# Patient Record
Sex: Male | Born: 1965
Health system: Southern US, Community
[De-identification: ages and names within clinical notes are randomized; demographics above are authoritative.]

## PROBLEM LIST (undated history)

## (undated) DIAGNOSIS — E119 Type 2 diabetes mellitus without complications: Secondary | ICD-10-CM

## (undated) DIAGNOSIS — F1910 Other psychoactive substance abuse, uncomplicated: Secondary | ICD-10-CM

---

## 2019-12-05 ENCOUNTER — Encounter (HOSPITAL_COMMUNITY): Payer: Self-pay

## 2019-12-05 ENCOUNTER — Ambulatory Visit (HOSPITAL_COMMUNITY)
Admission: EM | Admit: 2019-12-05 | Discharge: 2019-12-05 | Disposition: A | Discharge status: Home / Self Care | Payer: Self-pay | Attending: Family Medicine | Admitting: Family Medicine

## 2019-12-05 ENCOUNTER — Other Ambulatory Visit: Payer: Self-pay

## 2019-12-05 DIAGNOSIS — IMO0001 Reserved for inherently not codable concepts without codable children: Secondary | ICD-10-CM

## 2019-12-05 DIAGNOSIS — I10 Essential (primary) hypertension: Secondary | ICD-10-CM

## 2019-12-05 DIAGNOSIS — E119 Type 2 diabetes mellitus without complications: Secondary | ICD-10-CM

## 2019-12-05 DIAGNOSIS — M25511 Pain in right shoulder: Secondary | ICD-10-CM

## 2019-12-05 DIAGNOSIS — E785 Hyperlipidemia, unspecified: Secondary | ICD-10-CM

## 2019-12-05 DIAGNOSIS — F1020 Alcohol dependence, uncomplicated: Secondary | ICD-10-CM

## 2019-12-05 DIAGNOSIS — E1140 Type 2 diabetes mellitus with diabetic neuropathy, unspecified: Secondary | ICD-10-CM

## 2019-12-05 DIAGNOSIS — M25519 Pain in unspecified shoulder: Secondary | ICD-10-CM

## 2019-12-05 DIAGNOSIS — M25512 Pain in left shoulder: Secondary | ICD-10-CM

## 2019-12-05 LAB — GLUCOSE, CAPILLARY: Glucose-Capillary: 423 mg/dL — ABNORMAL HIGH (ref 70–99)

## 2019-12-05 LAB — CBG MONITORING, ED
Glucose-Capillary: 423 mg/dL — ABNORMAL HIGH (ref 70–99)
Glucose-Capillary: 423 mg/dL — ABNORMAL HIGH (ref 70–99)

## 2019-12-05 MED ORDER — SIMVASTATIN 20 MG PO TABS: 20.0000 mg | ORAL_TABLET | Freq: Every day | ORAL | 1 refills | Status: DC

## 2019-12-05 MED ORDER — LISINOPRIL 5 MG PO TABS: 5.0000 mg | ORAL_TABLET | Freq: Every day | ORAL | 1 refills | Status: DC

## 2019-12-05 MED ORDER — METFORMIN HCL 1000 MG PO TABS: 1000.0000 mg | ORAL_TABLET | Freq: Two times a day (BID) | ORAL | 1 refills | Status: DC

## 2019-12-05 MED ORDER — BLOOD GLUCOSE MONITOR KIT: PACK | 0 refills | Status: AC

## 2019-12-05 MED ORDER — NAPROXEN 500 MG PO TABS: 500.0000 mg | ORAL_TABLET | Freq: Two times a day (BID) | ORAL | 0 refills | Status: DC

## 2019-12-05 NOTE — Discharge Instructions (Signed)
Take Metformin 2 times a day for your high blood sugar.  Take with food.  Make sure you follow a diabetic diet plan Take lisinopril once a day.  This is a mild blood pressure medication.  It helps protect your kidneys against damage from diabetes. Take simvastatin once a day.  This is to lower your cholesterol.  It reduces your risk of heart attack and stroke Take naproxen 2 times a day with food.  This is an anti-inflammatory medicine.  I hope that it helps with your chronic shoulder pain You need to call today to get an appointment with the wellness clinic for follow-up and ongoing medical care.  We do not provide ongoing medical care or refills of prescriptions here

## 2019-12-05 NOTE — ED Triage Notes (Addendum)
Patient presents to Urgent Care with complaints of tingling and numbness in his lower extremitites since running out of his diabetes mediation about a week ago. Patient reports he does not have his glucose meter to check his sugar or his metformin or insulin at this time due to his financial situation. Pt also complains of mid/ lower back pain that is chronic but worse recently when he lifts heavy objects.  If patient is given prescriptions today he would like paper copies so he can take it to the discounted site that [facility] uses.

## 2019-12-05 NOTE — ED Provider Notes (Signed)
[facility]    CSN: [id number] Arrival date & time: 12/05/19  0909      History   Chief Complaint Chief Complaint  Patient presents with  . Hyperglycemia    HPI Steven Harvey is a 53 y.o. male.   HPI  Patient is currently living at the [facility] This is a halfway house for clients with addictions Interestingly, he denies having any addictions.  He does admit to drinking too much He is currently not employed.  He states that he is "homeless He is not taking any of his medications.  He is diabetic, hypertension, hyperlipidemia, and also complains of chronic low back pain. He has not had any blood work for a while. He does agree to go see a primary care doctor in follow-up He is here today requesting medication refill He states that he has numbness in his feet from his diabetes but does not think he has any kidney complications or other problems He has not had yearly eye exams We discussed the importance of regular medical care for diabetics to prevent complications.   History reviewed. No pertinent past medical history.  Patient Active Problem List   Diagnosis Date Noted  . Diabetes ([place]) 12/05/2019  . Essential hypertension 12/05/2019  . HLD (hyperlipidemia) 12/05/2019  . Pain in joint, shoulder region 12/05/2019  . Diabetic neuropathy ([place]) 12/05/2019  . Alcoholism /alcohol abuse ([place]) 12/05/2019    History reviewed. No pertinent surgical history.     Home Medications    Prior to Admission medications   Medication Sig Start Date End Date Taking? Authorizing Provider  blood glucose meter kit and supplies KIT Dispense based on patient and insurance preference. Use up to four times daily as directed. (FOR ICD-9 250.00, 250.01). 12/05/19   Raylene Everts, MD  lisinopril (ZESTRIL) 5 MG tablet Take 1 tablet (5 mg total) by mouth daily. 12/05/19   Raylene Everts, MD  metFORMIN (GLUCOPHAGE) 1000 MG tablet Take 1 tablet (1,000 mg total) by mouth  2 (two) times daily. 12/05/19   Raylene Everts, MD  naproxen (NAPROSYN) 500 MG tablet Take 1 tablet (500 mg total) by mouth 2 (two) times daily. 12/05/19   Raylene Everts, MD  simvastatin (ZOCOR) 20 MG tablet Take 1 tablet (20 mg total) by mouth daily. 12/05/19   Raylene Everts, MD    Family History Family History  Problem Relation Age of Onset  . Heart failure Mother   . Healthy Father     Social History Social History   Tobacco Use  . Smoking status: Former Smoker    Types: Cigarettes  . Smokeless tobacco: Never Used  Substance Use Topics  . Alcohol use: Not Currently    Comment: recently quit  . Drug use: Not on file     Allergies   Patient has no known allergies.   Review of Systems Review of Systems  Constitutional: Positive for fatigue. Negative for chills and fever.  HENT: Negative for congestion and hearing loss.   Eyes: Positive for visual disturbance. Negative for pain.  Respiratory: Negative for cough and shortness of breath.   Cardiovascular: Negative for chest pain and leg swelling.  Gastrointestinal: Negative for abdominal pain, constipation and diarrhea.  Genitourinary: Negative for dysuria and frequency.  Musculoskeletal: Positive for back pain. Negative for myalgias.  Neurological: Positive for numbness. Negative for dizziness, seizures and headaches.  Psychiatric/Behavioral: Positive for sleep disturbance. The patient is not nervous/anxious.      Physical Exam  Triage Vital Signs ED Triage Vitals  Enc Vitals Group     BP 12/05/19 0939 (!) 144/89     Pulse Rate 12/05/19 0939 72     Resp 12/05/19 0939 16     Temp 12/05/19 0939 98.3 F (36.8 C)     Temp Source 12/05/19 0939 Oral     SpO2 12/05/19 0939 100 %     Weight --      Height --      Head Circumference --      Peak Flow --      Pain Score 12/05/19 0935 5     Pain Loc --      Pain Edu? --      Excl. in [place]? --    No data found.  Updated Vital Signs BP (!) 144/89 (BP  Location: Right Arm)   Pulse 72   Temp 98.3 F (36.8 C) (Oral)   Resp 16   SpO2 100%       Physical Exam Constitutional:      General: He is not in acute distress.    Appearance: Normal appearance. He is well-developed and normal weight. He is ill-appearing.     Comments: Very thin  HENT:     Head: Normocephalic and atraumatic.     Mouth/Throat:     Mouth: Mucous membranes are moist.     Comments: Poor dentition Eyes:     Conjunctiva/sclera: Conjunctivae normal.     Pupils: Pupils are equal, round, and reactive to light.  Neck:     Vascular: No carotid bruit.  Cardiovascular:     Rate and Rhythm: Normal rate and regular rhythm.     Heart sounds: Murmur present.  Pulmonary:     Effort: Pulmonary effort is normal. No respiratory distress.  Abdominal:     General: There is no distension.     Palpations: Abdomen is soft.  Musculoskeletal:        General: Normal range of motion.     Cervical back: Normal range of motion and neck supple.     Right lower leg: No edema.     Left lower leg: No edema.  Skin:    General: Skin is warm and dry.  Neurological:     Mental Status: He is alert.  Psychiatric:        Mood and Affect: Mood normal.        Behavior: Behavior normal.      UC Treatments / Results  Labs (all labs ordered are listed, but only abnormal results are displayed) Labs Reviewed  GLUCOSE, CAPILLARY - Abnormal; Notable for the following components:      Result Value   Glucose-Capillary 423 (*)    All other components within normal limits  CBG MONITORING, ED - Abnormal; Notable for the following components:   Glucose-Capillary 423 (*)    All other components within normal limits  CBG MONITORING, ED - Abnormal; Notable for the following components:   Glucose-Capillary 423 (*)    All other components within normal limits    EKG   Radiology No results found.  Procedures Procedures (including critical care time)  Medications Ordered in UC Medications  - No data to display  Initial Impression / Assessment and Plan / UC Course  I have reviewed the triage vital signs and the nursing notes.  Pertinent labs & imaging results that were available during my care of the patient were reviewed by me and considered in my medical decision making (see  chart for details).     Discussed the importance of having a PCP.  Regular medical care.  Discussed ADA recommendations for regular maintenance.  Discussed the need for yearly eye exams.  Discussed diabetic complications.  Discussed health maintenance.  Gave patient name of her PCP.  Refilled medications. Final Clinical Impressions(s) / UC Diagnoses   Final diagnoses:  Essential hypertension  Hyperlipidemia, unspecified hyperlipidemia type  Type 2 diabetes mellitus with diabetic neuropathy, without long-term current use of insulin (HCC)  Alcoholism /alcohol abuse ([place])  Pain of both shoulder joints     Discharge Instructions     Take Metformin 2 times a day for your high blood sugar.  Take with food.  Make sure you follow a diabetic diet plan Take lisinopril once a day.  This is a mild blood pressure medication.  It helps protect your kidneys against damage from diabetes. Take simvastatin once a day.  This is to lower your cholesterol.  It reduces your risk of heart attack and stroke Take naproxen 2 times a day with food.  This is an anti-inflammatory medicine.  I hope that it helps with your chronic shoulder pain You need to call today to get an appointment with the wellness clinic for follow-up and ongoing medical care.  We do not provide ongoing medical care or refills of prescriptions here   ED Prescriptions    Medication Sig Dispense Auth. Provider   blood glucose meter kit and supplies KIT Dispense based on patient and insurance preference. Use up to four times daily as directed. (FOR ICD-9 250.00, 250.01). 1 each Raylene Everts, MD   lisinopril (ZESTRIL) 5 MG tablet Take 1 tablet (5 mg  total) by mouth daily. 30 tablet Raylene Everts, MD   metFORMIN (GLUCOPHAGE) 1000 MG tablet Take 1 tablet (1,000 mg total) by mouth 2 (two) times daily. 30 tablet Raylene Everts, MD   naproxen (NAPROSYN) 500 MG tablet Take 1 tablet (500 mg total) by mouth 2 (two) times daily. 30 tablet Raylene Everts, MD   simvastatin (ZOCOR) 20 MG tablet Take 1 tablet (20 mg total) by mouth daily. 30 tablet Raylene Everts, MD     PDMP not reviewed this encounter.   Raylene Everts, MD 12/05/19 [phone]

## 2020-01-28 ENCOUNTER — Encounter (HOSPITAL_COMMUNITY): Payer: Self-pay

## 2020-01-28 ENCOUNTER — Emergency Department (HOSPITAL_COMMUNITY)
Admission: EM | Admit: 2020-01-28 | Discharge: 2020-01-28 | Disposition: A | Discharge status: Home / Self Care | Payer: HRSA Program | Attending: Emergency Medicine | Admitting: Emergency Medicine

## 2020-01-28 ENCOUNTER — Emergency Department (HOSPITAL_COMMUNITY): Payer: HRSA Program

## 2020-01-28 ENCOUNTER — Other Ambulatory Visit: Payer: Self-pay

## 2020-01-28 DIAGNOSIS — R10817 Generalized abdominal tenderness: Secondary | ICD-10-CM | POA: Insufficient documentation

## 2020-01-28 DIAGNOSIS — R1013 Epigastric pain: Secondary | ICD-10-CM | POA: Insufficient documentation

## 2020-01-28 DIAGNOSIS — R112 Nausea with vomiting, unspecified: Secondary | ICD-10-CM | POA: Insufficient documentation

## 2020-01-28 DIAGNOSIS — M7918 Myalgia, other site: Secondary | ICD-10-CM | POA: Insufficient documentation

## 2020-01-28 DIAGNOSIS — R0789 Other chest pain: Secondary | ICD-10-CM

## 2020-01-28 DIAGNOSIS — R519 Headache, unspecified: Secondary | ICD-10-CM | POA: Insufficient documentation

## 2020-01-28 DIAGNOSIS — U071 COVID-19: Secondary | ICD-10-CM

## 2020-01-28 DIAGNOSIS — E119 Type 2 diabetes mellitus without complications: Secondary | ICD-10-CM | POA: Insufficient documentation

## 2020-01-28 DIAGNOSIS — R52 Pain, unspecified: Secondary | ICD-10-CM

## 2020-01-28 DIAGNOSIS — Z7984 Long term (current) use of oral hypoglycemic drugs: Secondary | ICD-10-CM | POA: Diagnosis not present

## 2020-01-28 HISTORY — DX: Type 2 diabetes mellitus without complications: E11.9

## 2020-01-28 LAB — HEPATIC FUNCTION PANEL
ALT: 13 U/L (ref 0–44)
AST: 15 U/L (ref 15–41)
Albumin: 3.4 g/dL — ABNORMAL LOW (ref 3.5–5.0)
Alkaline Phosphatase: 70 U/L (ref 38–126)
Bilirubin, Direct: <0.1 mg/dL (ref 0.0–0.2)
Total Bilirubin: 0.8 mg/dL (ref 0.3–1.2)
Total Protein: 6.5 g/dL (ref 6.5–8.1)

## 2020-01-28 LAB — CBC
HCT: 41.4 % (ref 39.0–52.0)
Hemoglobin: 13.2 g/dL (ref 13.0–17.0)
MCH: 28.1 pg (ref 26.0–34.0)
MCHC: 31.9 g/dL (ref 30.0–36.0)
MCV: 88.3 fL (ref 80.0–100.0)
Platelets: 201 10*3/uL (ref 150–400)
RBC: 4.69 MIL/uL (ref 4.22–5.81)
RDW: 12.1 % (ref 11.5–15.5)
WBC: 5.8 10*3/uL (ref 4.0–10.5)
nRBC: 0 % (ref 0.0–0.2)

## 2020-01-28 LAB — BASIC METABOLIC PANEL
Anion gap: 15 (ref 5–15)
BUN: 9 mg/dL (ref 6–20)
CO2: 21 mmol/L — ABNORMAL LOW (ref 22–32)
Calcium: 8.7 mg/dL — ABNORMAL LOW (ref 8.9–10.3)
Chloride: 100 mmol/L (ref 98–111)
Creatinine, Ser: 1.08 mg/dL (ref 0.61–1.24)
GFR calc Af Amer: >60 mL/min (ref >60)
GFR calc non Af Amer: >60 mL/min (ref >60)
Glucose, Bld: 409 mg/dL — ABNORMAL HIGH (ref 70–99)
Potassium: 4.1 mmol/L (ref 3.5–5.1)
Sodium: 136 mmol/L (ref 135–145)

## 2020-01-28 LAB — POC SARS CORONAVIRUS 2 AG -  ED: SARS Coronavirus 2 Ag: POSITIVE — AB

## 2020-01-28 LAB — TROPONIN I (HIGH SENSITIVITY)
Troponin I (High Sensitivity): 5 ng/L (ref <18)
Troponin I (High Sensitivity): 8 ng/L (ref <18)

## 2020-01-28 LAB — D-DIMER, QUANTITATIVE: D-Dimer, Quant: 0.78 ug/mL-FEU — ABNORMAL HIGH (ref 0.00–0.50)

## 2020-01-28 LAB — CBG MONITORING, ED: Glucose-Capillary: 311 mg/dL — ABNORMAL HIGH (ref 70–99)

## 2020-01-28 LAB — LIPASE, BLOOD: Lipase: 24 U/L (ref 11–51)

## 2020-01-28 MED ORDER — IOHEXOL 350 MG/ML SOLN
100.0000 mL | Freq: Once | INTRAVENOUS | Status: AC | PRN
  Administered 2020-01-28: 100 mL via INTRAVENOUS

## 2020-01-28 MED ORDER — ONDANSETRON 4 MG PO TBDP: 4.0000 mg | ORAL_TABLET | Freq: Three times a day (TID) | ORAL | 0 refills | Status: DC | PRN

## 2020-01-28 MED ORDER — SODIUM CHLORIDE 0.9% FLUSH
3.0000 mL | Freq: Once | INTRAVENOUS | Status: AC
  Administered 2020-01-28: 3 mL via INTRAVENOUS

## 2020-01-28 MED ORDER — ACETAMINOPHEN 325 MG PO TABS
650.0000 mg | ORAL_TABLET | Freq: Once | ORAL | Status: AC
  Administered 2020-01-28: 650 mg via ORAL
  Filled 2020-01-28: qty 2

## 2020-01-28 MED ORDER — PANTOPRAZOLE SODIUM 40 MG IV SOLR
40.0000 mg | Freq: Once | INTRAVENOUS | Status: AC
  Administered 2020-01-28: 40 mg via INTRAVENOUS
  Filled 2020-01-28: qty 40

## 2020-01-28 MED ORDER — SODIUM CHLORIDE 0.9 % IV BOLUS
1000.0000 mL | Freq: Once | INTRAVENOUS | Status: AC
  Administered 2020-01-28: 1000 mL via INTRAVENOUS

## 2020-01-28 MED ORDER — IBUPROFEN 400 MG PO TABS: 400.0000 mg | ORAL_TABLET | Freq: Three times a day (TID) | ORAL | 0 refills | Status: DC | PRN

## 2020-01-28 MED ORDER — LISINOPRIL 5 MG PO TABS: 5.0000 mg | ORAL_TABLET | Freq: Every day | ORAL | 0 refills | Status: DC

## 2020-01-28 MED ORDER — METFORMIN HCL 1000 MG PO TABS: 1000.0000 mg | ORAL_TABLET | Freq: Two times a day (BID) | ORAL | 0 refills | Status: DC

## 2020-01-28 MED ORDER — ACETAMINOPHEN 325 MG PO CAPS: 650.0000 mg | ORAL_CAPSULE | Freq: Three times a day (TID) | ORAL | 0 refills | Status: DC | PRN

## 2020-01-28 MED ORDER — ALUM & MAG HYDROXIDE-SIMETH 200-200-20 MG/5ML PO SUSP
30.0000 mL | Freq: Once | ORAL | Status: AC
  Administered 2020-01-28: 30 mL via ORAL
  Filled 2020-01-28: qty 30

## 2020-01-28 MED ORDER — IBUPROFEN 400 MG PO TABS
600.0000 mg | ORAL_TABLET | Freq: Once | ORAL | Status: AC
  Administered 2020-01-28: 600 mg via ORAL
  Filled 2020-01-28: qty 1

## 2020-01-28 MED ORDER — LIDOCAINE VISCOUS HCL 2 % MT SOLN
15.0000 mL | Freq: Once | OROMUCOSAL | Status: AC
  Administered 2020-01-28: 15 mL via ORAL
  Filled 2020-01-28: qty 15

## 2020-01-28 NOTE — ED Notes (Signed)
Patient verbalizes understanding of discharge instructions. Opportunity for questioning and answers were provided. Armband removed by staff, pt discharged from ED.  

## 2020-01-28 NOTE — Care Management (Addendum)
ED CM spoke with [facility] liaison, a referral packet was sent over and completed and sent back, patient was approved, patient awaiting [facility] transport

## 2020-01-28 NOTE — ED Provider Notes (Signed)
[facility] EMERGENCY DEPARTMENT Provider Note   CSN: [id number] Arrival date & time: 01/28/20  1884     History Chief Complaint  Patient presents with  . Chest Pain    Steven Harvey is a 54 y.o. male.  The history is provided by the patient and medical records. No language interpreter was used.  Chest Pain  Steven Harvey is a 54 y.o. male who presents to the Emergency Department complaining of chest pain and abdominal pain. He presents the emergency department complaining of multiple complaints that began two days ago. He states that he is in a transitional house for alcohol rehabilitation for the last two months. As in agreement for this program he is required to work six days a week. 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 Over the last two days he is experienced headaches, left-sided chest pain, epigastric pain. Yesterday he developed nausea and vomiting. He complains of associated poor appetite. He denies any fevers. No diarrhea. No known COVID 19 exposures but he is in a group home environment. He has a history of diabetes. He is not had alcohol for two months. He does smoke two cigarettes a day. Symptoms are moderate to severe, constant, worsening.    Past Medical History:  Diagnosis Date  . Diabetes mellitus without complication [facility])     Patient Active Problem List   Diagnosis Date Noted  . Diabetes ([place]) 12/05/2019  . Essential hypertension 12/05/2019  . HLD (hyperlipidemia) 12/05/2019  . Pain in joint, shoulder region 12/05/2019  . Diabetic neuropathy ([place]) 12/05/2019  . Alcoholism /alcohol abuse ([place]) 12/05/2019    History reviewed. No pertinent surgical history.     Family History  Problem Relation Age of Onset  . Heart failure Mother   . Healthy Father     Social History   Tobacco Use  . Smoking status: Former Smoker    Types: Cigarettes  . Smokeless tobacco: Never Used    Substance Use Topics  . Alcohol use: Not Currently    Comment: recently quit  . Drug use: Not on file    Home Medications Prior to Admission medications   Medication Sig Start Date End Date Taking? Authorizing Provider  blood glucose meter kit and supplies KIT Dispense based on patient and insurance preference. Use up to four times daily as directed. (FOR ICD-9 250.00, 250.01). 12/05/19   Raylene Everts, MD  lisinopril (ZESTRIL) 5 MG tablet Take 1 tablet (5 mg total) by mouth daily. 12/05/19   Raylene Everts, MD  metFORMIN (GLUCOPHAGE) 1000 MG tablet Take 1 tablet (1,000 mg total) by mouth 2 (two) times daily. 12/05/19   Raylene Everts, MD  naproxen (NAPROSYN) 500 MG tablet Take 1 tablet (500 mg total) by mouth 2 (two) times daily. 12/05/19   Raylene Everts, MD  simvastatin (ZOCOR) 20 MG tablet Take 1 tablet (20 mg total) by mouth daily. 12/05/19   Raylene Everts, MD    Allergies    Patient has no known allergies.  Review of Systems   Review of Systems  Cardiovascular: Positive for chest pain.  All other systems reviewed and are negative.   Physical Exam Updated Vital Signs BP (!) 167/73 (BP Location: Left Arm)   Pulse 78   Temp 98.7 F (37.1 C) (Oral)   Resp 12   Ht '5\' 8"'$  (1.727 m)   Wt 59.9 kg   SpO2 100%  BMI 20.07 kg/m   Physical Exam Vitals and nursing note reviewed.  Constitutional:      Appearance: He is well-developed.  HENT:     Head: Normocephalic and atraumatic.  Cardiovascular:     Rate and Rhythm: Normal rate and regular rhythm.     Heart sounds: No murmur.  Pulmonary:     Effort: Pulmonary effort is normal. No respiratory distress.     Breath sounds: Normal breath sounds.  Abdominal:     Palpations: Abdomen is soft.     Tenderness: There is no guarding or rebound.     Comments: Mild generalized abdominal tenderness  Musculoskeletal:        General: No tenderness.     Comments: 2+ radial and DP pulses bilaterally  Skin:     General: Skin is warm and dry.  Neurological:     Mental Status: He is alert and oriented to person, place, and time.     Comments: Five out of five strength in all four extremities.  Psychiatric:        Behavior: Behavior normal.     ED Results / Procedures / Treatments   Labs (all labs ordered are listed, but only abnormal results are displayed) Labs Reviewed  BASIC METABOLIC PANEL - Abnormal; Notable for the following components:      Result Value   CO2 21 (*)    Glucose, Bld 409 (*)    Calcium 8.7 (*)    All other components within normal limits  HEPATIC FUNCTION PANEL - Abnormal; Notable for the following components:   Albumin 3.4 (*)    All other components within normal limits  D-DIMER, QUANTITATIVE (NOT AT [facility]) - Abnormal; Notable for the following components:   D-Dimer, Quant 0.78 (*)    All other components within normal limits  POC SARS CORONAVIRUS 2 AG -  ED - Abnormal; Notable for the following components:   SARS Coronavirus 2 Ag POSITIVE (*)    All other components within normal limits  CBG MONITORING, ED - Abnormal; Notable for the following components:   Glucose-Capillary 311 (*)    All other components within normal limits  CBC  LIPASE, BLOOD  TROPONIN I (HIGH SENSITIVITY)  TROPONIN I (HIGH SENSITIVITY)    EKG EKG Interpretation  Date/Time:  Saturday January 28 2020 06:36:16 EST Ventricular Rate:  101 PR Interval:  140 QRS Duration: 76 QT Interval:  370 QTC Calculation: 479 R Axis:   85 Text Interpretation: Sinus tachycardia Septal infarct , age undetermined Abnormal ECG no prior available for comparison Confirmed by Quintella Reichert [phone]) on 01/28/2020 6:53:38 AM   Radiology DG Chest 2 View  Result Date: 01/28/2020 CLINICAL DATA:  Chest pain EXAM: CHEST - 2 VIEW COMPARISON:  None. FINDINGS: Normal heart size and mediastinal contours. Artifact from EKG leads. No acute infiltrate or edema. No effusion or pneumothorax. No acute osseous findings.  IMPRESSION: Negative chest. Electronically Signed   By: Monte Fantasia M.D.   On: 01/28/2020 06:59   CT Angio Chest/Abd/Pel for Dissection W and/or W/WO  Result Date: 01/28/2020 CLINICAL DATA:  Chest and back pain beginning today. Possible aortic dissection. EXAM: CT ANGIOGRAPHY CHEST, ABDOMEN AND PELVIS TECHNIQUE: Multidetector CT imaging through the chest, abdomen and pelvis was performed using the standard protocol during bolus administration of intravenous contrast. Multiplanar reconstructed images and MIPs were obtained and reviewed to evaluate the vascular anatomy. CONTRAST:  100 mL Omnipaque 350 IV. COMPARISON:  None. FINDINGS: CTA CHEST FINDINGS Cardiovascular: Heart is normal  size. Thoracic aorta is normal in caliber without evidence of aneurysm or dissection. Normal 3 vessel takeoff from the arch. Pulmonary arterial system is well opacified and otherwise unremarkable. Mediastinum/Nodes: No mediastinal or hilar adenopathy. Remaining mediastinal structures are normal. Mildly prominent right axillary lymph node measuring 1.2 cm by short axis. Lungs/Pleura: Lungs are clear.  Airways are normal. Musculoskeletal: No acute findings. Review of the MIP images confirms the above findings. CTA ABDOMEN AND PELVIS FINDINGS VASCULAR Aorta: Normal in caliber without evidence of aneurysm or dissection. Celiac: Patent. SMA: Patent. Renals: Single bilateral renal arteries are patent. IMA: Patent. Inflow: Minimal calcified plaque over the right common iliac artery as the iliac arteries are otherwise patent. Minimal calcified plaque at the femoral artery bifurcation bilaterally. Veins: Normal. Review of the MIP images confirms the above findings. NON-VASCULAR Hepatobiliary: Liver, gallbladder and biliary tree are normal. Pancreas: Normal. Spleen: Normal. Adrenals/Urinary Tract: Adrenal glands are normal. Kidneys are normal in size without hydronephrosis or nephrolithiasis. Bladder mildly distended. Stomach/Bowel: Stomach  and small bowel are normal. Appendix is normal. Colon is unremarkable. Lymphatic: No adenopathy. Reproductive: Normal. Other: No free fluid or focal inflammatory change. Musculoskeletal: No acute findings. Review of the MIP images confirms the above findings. IMPRESSION: 1. Unremarkable thoracoabdominal aorta without evidence of aneurysm or dissection. 2.  No acute findings in the chest, abdomen or pelvis. 3. Prominent right axillary lymph node measuring 1.2 cm by short axis. Recommend clinical correlation and right axillary ultrasound for further evaluation. Electronically Signed   By: Marin Olp M.D.   On: 01/28/2020 09:20    Procedures Procedures (including critical care time)  Medications Ordered in ED Medications  sodium chloride flush (NS) 0.9 % injection 3 mL (3 mLs Intravenous Given 01/28/20 0800)  alum & mag hydroxide-simeth (MAALOX/MYLANTA) 200-200-20 MG/5ML suspension 30 mL (30 mLs Oral Given 01/28/20 0756)    And  lidocaine (XYLOCAINE) 2 % viscous mouth solution 15 mL (15 mLs Oral Given 01/28/20 0756)  pantoprazole (PROTONIX) injection 40 mg (40 mg Intravenous Given 01/28/20 0756)  sodium chloride 0.9 % bolus 1,000 mL (0 mLs Intravenous Stopped 01/28/20 1101)  iohexol (OMNIPAQUE) 350 MG/ML injection 100 mL (100 mLs Intravenous Contrast Given 01/28/20 0902)  ibuprofen (ADVIL) tablet 600 mg (600 mg Oral Given 01/28/20 1116)  acetaminophen (TYLENOL) tablet 650 mg (650 mg Oral Given 01/28/20 1117)    ED Course  I have reviewed the triage vital signs and the nursing notes.  Pertinent labs & imaging results that were available during my care of the patient were reviewed by me and considered in my medical decision making (see chart for details).    MDM Rules/Calculators/A&P                     Patient with history of diabetes here for evaluation of chest pain, abdominal pain, back pain. He is non-toxic appearing on evaluation and in no acute distress. Presentation is not consistent with  ACS, PE. CTA was obtained due to patient's abdominal pain radiating to chest and back. CT is negative for dissection, PE or infiltrate. COVID-19 antigen is positive, this is consistent with his symptoms. He has no respiratory distress. Feel patient is appropriate for discharge home. Given he is in a group home environment due to history of alcohol abuse case management consulted for assistance in placement.  Final Clinical Impression(s) / ED Diagnoses Final diagnoses:  COVID-19 virus infection  Atypical chest pain  Generalized body aches    Rx / DC Orders ED  Discharge Orders    None       Quintella Reichert, MD 01/28/20 1222

## 2020-01-28 NOTE — ED Triage Notes (Signed)
Pt reports that he has been having CP, upper abd pain, vomiting and pain that radiates to both shoulders since yesterday

## 2020-01-28 NOTE — Care Management (Signed)
Patient lives at [facility] and is [employer] and is not able to isolate there. Patient mets criteria for Covid + hotel arrangements. Call placed to Darlina Sicilian of [employer] [employer] to coordinate the hotel arrangements, awaiting a return call.

## 2020-01-29 ENCOUNTER — Telehealth: Payer: Self-pay | Admitting: Nurse Practitioner

## 2020-01-29 ENCOUNTER — Telehealth: Payer: Self-pay | Admitting: Adult Health

## 2020-01-29 NOTE — Telephone Encounter (Signed)
Second attempt made to contact  Steven Harvey about Covid symptoms and the use of bamlanivimab, a monoclonal antibody infusion for those with mild to moderate Covid symptoms and at a high risk of hospitalization.     Pt is qualified for this infusion at the [facility] infusion center due to co-morbid conditions and/or a member of an at-risk group  Unable to reach.    Patient Active Problem List   Diagnosis Date Noted  . Diabetes (HCC) 12/05/2019  . Essential hypertension 12/05/2019  . HLD (hyperlipidemia) 12/05/2019  . Pain in joint, shoulder region 12/05/2019  . Diabetic neuropathy (HCC) 12/05/2019  . Alcoholism /alcohol abuse (HCC) 12/05/2019    Willette Alma, AGPCNP-BC Pager: [phone] Amion: N. [place]

## 2020-01-29 NOTE — Telephone Encounter (Signed)
Called patient to discuss positive COVID 19 test results to check to see how he is feeling and evaluate if he is a candidate for Monoclonal Antibody infusion as he is in high risk category .  Unable to reach . Left message to call back.    Georgina Krist NP-C

## 2020-01-30 ENCOUNTER — Telehealth: Payer: Self-pay | Admitting: Adult Health

## 2020-01-30 ENCOUNTER — Other Ambulatory Visit: Payer: Self-pay | Admitting: Adult Health

## 2020-01-30 DIAGNOSIS — U071 COVID-19: Secondary | ICD-10-CM

## 2020-01-30 NOTE — Telephone Encounter (Signed)
  Called and spoke to representative at [facility]- Listed number:  [phone]. Gentleman I spoke with provided new contact number for Steven Harvey: [phone], ext [phone]  Transferred to ext [phone]  I connected by phone with Steven Harvey on 01/30/2020 at 9:20 AM to discuss the potential use of an new treatment for mild to moderate COVID-19 viral infection in non-hospitalized patients.  This patient is a 54 y.o. male that meets the FDA criteria for Emergency Use Authorization of bamlanivimab or casirivimab\imdevimab.  Has a (+) direct SARS-CoV-2 viral test result  Has mild or moderate COVID-19 - currently experiencing body aches, nausea without vomiting, abdominal pain, intermittent non-productive cough.  Is ? 54 years of age and weighs ? 40 kg  Is NOT hospitalized due to COVID-19  Is NOT requiring oxygen therapy or requiring an increase in baseline oxygen flow rate due to COVID-19  Is within 10 days of symptom onset- Sx onset 2/18, ED visit 2/20- positive test.  Has at least one of the high risk factor(s) for progression to severe COVID-19 and/or hospitalization as defined in EUA.  Specific high risk criteria : Diabetes   I have spoken and communicated the following to the patient or parent/caregiver:  1. FDA has authorized the emergency use of bamlanivimab and casirivimab\imdevimab for the treatment of mild to moderate COVID-19 in adults and pediatric patients with positive results of direct SARS-CoV-2 viral testing who are 45 years of age and older weighing at least 40 kg, and who are at high risk for progressing to severe COVID-19 and/or hospitalization.  2. The significant known and potential risks and benefits of bamlanivimab and casirivimab\imdevimab, and the extent to which such potential risks and benefits are unknown.  3. Information on available alternative treatments and the risks and benefits of those alternatives, including clinical trials.  4. Patients treated with  bamlanivimab and casirivimab\imdevimab should continue to self-isolate and use infection control measures (e.g., wear mask, isolate, social distance, avoid sharing personal items, clean and disinfect "high touch" surfaces, and frequent handwashing) according to CDC guidelines.   5. The patient or parent/caregiver has the option to accept or refuse bamlanivimab or casirivimab\imdevimab .  Red Flag sx's reviewed and if any develop he was advised to seek immediate medical assistance- he verbalized understanding/agreement.                    Advised him to call his Social Worker to assist with procuring medications from recent ED visit.  After reviewing this information with the patient, The patient agreed to proceed with receiving the bamlanimivab infusion and will be provided a copy of the Fact sheet prior to receiving the infusion.Orpha Bur D Ariellah Faust 01/30/2020 9:20 AM

## 2020-01-31 ENCOUNTER — Ambulatory Visit (HOSPITAL_COMMUNITY): Payer: Self-pay

## 2020-02-01 ENCOUNTER — Ambulatory Visit (HOSPITAL_COMMUNITY)
Admission: RE | Admit: 2020-02-01 | Discharge: 2020-02-01 | Disposition: A | Discharge status: Home / Self Care | Payer: HRSA Program | Source: Ambulatory Visit | Attending: Pulmonary Disease | Admitting: Pulmonary Disease

## 2020-02-01 DIAGNOSIS — U071 COVID-19: Secondary | ICD-10-CM

## 2020-02-01 MED ORDER — DIPHENHYDRAMINE HCL 50 MG/ML IJ SOLN: 50.0000 mg | Freq: Once | INTRAMUSCULAR | Status: DC | PRN

## 2020-02-01 MED ORDER — ALBUTEROL SULFATE HFA 108 (90 BASE) MCG/ACT IN AERS: 2.0000 | INHALATION_SPRAY | Freq: Once | RESPIRATORY_TRACT | Status: DC | PRN

## 2020-02-01 MED ORDER — SODIUM CHLORIDE 0.9 % IV SOLN
700.0000 mg | Freq: Once | INTRAVENOUS | Status: AC
  Administered 2020-02-01: 700 mg via INTRAVENOUS
  Filled 2020-02-01: qty 20

## 2020-02-01 MED ORDER — ONDANSETRON HCL 4 MG/2ML IJ SOLN
4.0000 mg | Freq: Once | INTRAMUSCULAR | Status: AC
  Administered 2020-02-01: 4 mg via INTRAVENOUS
  Filled 2020-02-01: qty 2

## 2020-02-01 MED ORDER — METHYLPREDNISOLONE SODIUM SUCC 125 MG IJ SOLR: 125.0000 mg | Freq: Once | INTRAMUSCULAR | Status: DC | PRN

## 2020-02-01 MED ORDER — IBUPROFEN 200 MG PO TABS
400.0000 mg | ORAL_TABLET | Freq: Once | ORAL | Status: AC
  Administered 2020-02-01: 400 mg via ORAL
  Filled 2020-02-01: qty 2

## 2020-02-01 MED ORDER — SODIUM CHLORIDE 0.9 % IV SOLN: INTRAVENOUS | Status: DC | PRN

## 2020-02-01 MED ORDER — EPINEPHRINE 0.3 MG/0.3ML IJ SOAJ: 0.3000 mg | Freq: Once | INTRAMUSCULAR | Status: DC | PRN

## 2020-02-01 MED ORDER — FAMOTIDINE IN NACL 20-0.9 MG/50ML-% IV SOLN: 20.0000 mg | Freq: Once | INTRAVENOUS | Status: DC | PRN

## 2020-02-01 NOTE — Progress Notes (Signed)
  Diagnosis: COVID-19  Physician: Dr. Wright  Procedure: [facility] Infusion [facility] Med: bamlanivimab infusion - Provided patient with bamlanimivab fact sheet for patients, parents and caregivers prior to infusion.  Complications: No immediate complications noted.  Discharge: Discharged home   Sederick Jacobsen L 02/01/2020  

## 2020-02-01 NOTE — Discharge Instructions (Signed)

## 2020-02-04 ENCOUNTER — Encounter (HOSPITAL_COMMUNITY): Payer: Self-pay | Admitting: Family Medicine

## 2020-02-04 ENCOUNTER — Other Ambulatory Visit: Payer: Self-pay

## 2020-02-04 ENCOUNTER — Emergency Department (HOSPITAL_COMMUNITY): Payer: Self-pay

## 2020-02-04 ENCOUNTER — Inpatient Hospital Stay (HOSPITAL_COMMUNITY)
Admission: EM | Admit: 2020-02-04 | Discharge: 2020-02-22 | DRG: 871 | Disposition: A | Discharge status: Home / Self Care | Payer: Self-pay | Attending: Family Medicine | Admitting: Family Medicine

## 2020-02-04 DIAGNOSIS — E1140 Type 2 diabetes mellitus with diabetic neuropathy, unspecified: Secondary | ICD-10-CM | POA: Diagnosis present

## 2020-02-04 DIAGNOSIS — E785 Hyperlipidemia, unspecified: Secondary | ICD-10-CM | POA: Diagnosis present

## 2020-02-04 DIAGNOSIS — Z791 Long term (current) use of non-steroidal anti-inflammatories (NSAID): Secondary | ICD-10-CM

## 2020-02-04 DIAGNOSIS — R0602 Shortness of breath: Secondary | ICD-10-CM

## 2020-02-04 DIAGNOSIS — R110 Nausea: Secondary | ICD-10-CM

## 2020-02-04 DIAGNOSIS — Z87891 Personal history of nicotine dependence: Secondary | ICD-10-CM

## 2020-02-04 DIAGNOSIS — I808 Phlebitis and thrombophlebitis of other sites: Secondary | ICD-10-CM

## 2020-02-04 DIAGNOSIS — Z681 Body mass index (BMI) 19 or less, adult: Secondary | ICD-10-CM

## 2020-02-04 DIAGNOSIS — E876 Hypokalemia: Secondary | ICD-10-CM | POA: Diagnosis present

## 2020-02-04 DIAGNOSIS — K2971 Gastritis, unspecified, with bleeding: Secondary | ICD-10-CM | POA: Diagnosis not present

## 2020-02-04 DIAGNOSIS — R64 Cachexia: Secondary | ICD-10-CM | POA: Diagnosis present

## 2020-02-04 DIAGNOSIS — N179 Acute kidney failure, unspecified: Secondary | ICD-10-CM | POA: Diagnosis present

## 2020-02-04 DIAGNOSIS — U071 COVID-19: Secondary | ICD-10-CM | POA: Diagnosis present

## 2020-02-04 DIAGNOSIS — E878 Other disorders of electrolyte and fluid balance, not elsewhere classified: Secondary | ICD-10-CM | POA: Diagnosis present

## 2020-02-04 DIAGNOSIS — I10 Essential (primary) hypertension: Secondary | ICD-10-CM | POA: Diagnosis present

## 2020-02-04 DIAGNOSIS — I4892 Unspecified atrial flutter: Secondary | ICD-10-CM | POA: Diagnosis present

## 2020-02-04 DIAGNOSIS — I472 Ventricular tachycardia: Secondary | ICD-10-CM | POA: Diagnosis present

## 2020-02-04 DIAGNOSIS — Z79899 Other long term (current) drug therapy: Secondary | ICD-10-CM

## 2020-02-04 DIAGNOSIS — Z8249 Family history of ischemic heart disease and other diseases of the circulatory system: Secondary | ICD-10-CM

## 2020-02-04 DIAGNOSIS — E860 Dehydration: Secondary | ICD-10-CM | POA: Diagnosis present

## 2020-02-04 DIAGNOSIS — F1010 Alcohol abuse, uncomplicated: Secondary | ICD-10-CM | POA: Diagnosis present

## 2020-02-04 DIAGNOSIS — X58XXXA Exposure to other specified factors, initial encounter: Secondary | ICD-10-CM | POA: Diagnosis present

## 2020-02-04 DIAGNOSIS — D6489 Other specified anemias: Secondary | ICD-10-CM | POA: Diagnosis not present

## 2020-02-04 DIAGNOSIS — E1110 Type 2 diabetes mellitus with ketoacidosis without coma: Secondary | ICD-10-CM | POA: Diagnosis present

## 2020-02-04 DIAGNOSIS — Z5320 Procedure and treatment not carried out because of patient's decision for unspecified reasons: Secondary | ICD-10-CM | POA: Diagnosis not present

## 2020-02-04 DIAGNOSIS — E870 Hyperosmolality and hypernatremia: Secondary | ICD-10-CM | POA: Diagnosis present

## 2020-02-04 DIAGNOSIS — Z95828 Presence of other vascular implants and grafts: Secondary | ICD-10-CM

## 2020-02-04 DIAGNOSIS — Z7984 Long term (current) use of oral hypoglycemic drugs: Secondary | ICD-10-CM

## 2020-02-04 DIAGNOSIS — Z590 Homelessness: Secondary | ICD-10-CM

## 2020-02-04 DIAGNOSIS — T68XXXA Hypothermia, initial encounter: Secondary | ICD-10-CM | POA: Diagnosis present

## 2020-02-04 DIAGNOSIS — A4189 Other specified sepsis: Principal | ICD-10-CM | POA: Diagnosis present

## 2020-02-04 LAB — I-STAT CHEM 8, ED
BUN: 39 mg/dL — ABNORMAL HIGH (ref 6–20)
Calcium, Ion: 1.15 mmol/L (ref 1.15–1.40)
Chloride: 113 mmol/L — ABNORMAL HIGH (ref 98–111)
Creatinine, Ser: 1.7 mg/dL — ABNORMAL HIGH (ref 0.61–1.24)
Glucose, Bld: >700 mg/dL (ref 70–99)
HCT: 50 % (ref 39.0–52.0)
Hemoglobin: 17 g/dL (ref 13.0–17.0)
Potassium: 3.8 mmol/L (ref 3.5–5.1)
Sodium: 143 mmol/L (ref 135–145)
TCO2: 8 mmol/L — ABNORMAL LOW (ref 22–32)

## 2020-02-04 LAB — BASIC METABOLIC PANEL
Anion gap: 16 — ABNORMAL HIGH (ref 5–15)
BUN: 40 mg/dL — ABNORMAL HIGH (ref 6–20)
BUN: 40 mg/dL — ABNORMAL HIGH (ref 6–20)
BUN: 35 mg/dL — ABNORMAL HIGH (ref 6–20)
CO2: <7 mmol/L — ABNORMAL LOW (ref 22–32)
CO2: <7 mmol/L — ABNORMAL LOW (ref 22–32)
CO2: 14 mmol/L — ABNORMAL LOW (ref 22–32)
Calcium: 9.1 mg/dL (ref 8.9–10.3)
Calcium: 9.2 mg/dL (ref 8.9–10.3)
Calcium: 9.1 mg/dL (ref 8.9–10.3)
Chloride: 105 mmol/L (ref 98–111)
Chloride: 116 mmol/L — ABNORMAL HIGH (ref 98–111)
Chloride: 121 mmol/L — ABNORMAL HIGH (ref 98–111)
Creatinine, Ser: 2.59 mg/dL — ABNORMAL HIGH (ref 0.61–1.24)
Creatinine, Ser: 2.22 mg/dL — ABNORMAL HIGH (ref 0.61–1.24)
Creatinine, Ser: 1.93 mg/dL — ABNORMAL HIGH (ref 0.61–1.24)
GFR calc Af Amer: 31 mL/min — ABNORMAL LOW (ref >60)
GFR calc Af Amer: 38 mL/min — ABNORMAL LOW (ref >60)
GFR calc Af Amer: 45 mL/min — ABNORMAL LOW (ref >60)
GFR calc non Af Amer: 27 mL/min — ABNORMAL LOW (ref >60)
GFR calc non Af Amer: 33 mL/min — ABNORMAL LOW (ref >60)
GFR calc non Af Amer: 39 mL/min — ABNORMAL LOW (ref >60)
Glucose, Bld: 725 mg/dL (ref 70–99)
Glucose, Bld: 492 mg/dL — ABNORMAL HIGH (ref 70–99)
Glucose, Bld: 367 mg/dL — ABNORMAL HIGH (ref 70–99)
Potassium: 4.2 mmol/L (ref 3.5–5.1)
Potassium: 4.1 mmol/L (ref 3.5–5.1)
Potassium: 3.4 mmol/L — ABNORMAL LOW (ref 3.5–5.1)
Sodium: 145 mmol/L (ref 135–145)
Sodium: 151 mmol/L — ABNORMAL HIGH (ref 135–145)
Sodium: 151 mmol/L — ABNORMAL HIGH (ref 135–145)

## 2020-02-04 LAB — GLUCOSE, CAPILLARY
Glucose-Capillary: 542 mg/dL (ref 70–99)
Glucose-Capillary: 509 mg/dL (ref 70–99)
Glucose-Capillary: 562 mg/dL (ref 70–99)
Glucose-Capillary: 423 mg/dL — ABNORMAL HIGH (ref 70–99)
Glucose-Capillary: 486 mg/dL — ABNORMAL HIGH (ref 70–99)
Glucose-Capillary: 404 mg/dL — ABNORMAL HIGH (ref 70–99)
Glucose-Capillary: 434 mg/dL — ABNORMAL HIGH (ref 70–99)
Glucose-Capillary: 378 mg/dL — ABNORMAL HIGH (ref 70–99)
Glucose-Capillary: 401 mg/dL — ABNORMAL HIGH (ref 70–99)
Glucose-Capillary: 378 mg/dL — ABNORMAL HIGH (ref 70–99)
Glucose-Capillary: 324 mg/dL — ABNORMAL HIGH (ref 70–99)

## 2020-02-04 LAB — CBG MONITORING, ED
Glucose-Capillary: >600 mg/dL (ref 70–99)
Glucose-Capillary: 596 mg/dL (ref 70–99)

## 2020-02-04 LAB — URINALYSIS, ROUTINE W REFLEX MICROSCOPIC
Bilirubin Urine: NEGATIVE
Glucose, UA: >=500 mg/dL — AB
Ketones, ur: 80 mg/dL — AB
Leukocytes,Ua: NEGATIVE
Nitrite: NEGATIVE
Protein, ur: 100 mg/dL — AB
Specific Gravity, Urine: 1.018 (ref 1.005–1.030)
pH: 5 (ref 5.0–8.0)

## 2020-02-04 LAB — HEPATIC FUNCTION PANEL
ALT: 12 U/L (ref 0–44)
AST: 15 U/L (ref 15–41)
Albumin: 3.2 g/dL — ABNORMAL LOW (ref 3.5–5.0)
Alkaline Phosphatase: 88 U/L (ref 38–126)
Bilirubin, Direct: <0.1 mg/dL (ref 0.0–0.2)
Total Bilirubin: 1.9 mg/dL — ABNORMAL HIGH (ref 0.3–1.2)
Total Protein: 7.8 g/dL (ref 6.5–8.1)

## 2020-02-04 LAB — CBC WITH DIFFERENTIAL/PLATELET
Abs Immature Granulocytes: 1.05 10*3/uL — ABNORMAL HIGH (ref 0.00–0.07)
Basophils Absolute: 0.2 10*3/uL — ABNORMAL HIGH (ref 0.0–0.1)
Basophils Relative: 1 %
Eosinophils Absolute: 0 10*3/uL (ref 0.0–0.5)
Eosinophils Relative: 0 %
HCT: 51.3 % (ref 39.0–52.0)
Hemoglobin: 15.8 g/dL (ref 13.0–17.0)
Immature Granulocytes: 4 %
Lymphocytes Relative: 9 %
Lymphs Abs: 2.1 10*3/uL (ref 0.7–4.0)
MCH: 28 pg (ref 26.0–34.0)
MCHC: 30.8 g/dL (ref 30.0–36.0)
MCV: 90.8 fL (ref 80.0–100.0)
Monocytes Absolute: 2.6 10*3/uL — ABNORMAL HIGH (ref 0.1–1.0)
Monocytes Relative: 11 %
Neutro Abs: 18 10*3/uL — ABNORMAL HIGH (ref 1.7–7.7)
Neutrophils Relative %: 75 %
Platelets: 330 10*3/uL (ref 150–400)
RBC: 5.65 MIL/uL (ref 4.22–5.81)
RDW: 13.2 % (ref 11.5–15.5)
WBC: 23.8 10*3/uL — ABNORMAL HIGH (ref 4.0–10.5)
nRBC: 0 % (ref 0.0–0.2)

## 2020-02-04 LAB — BLOOD GAS, ARTERIAL
Acid-base deficit: 12.4 mmol/L — ABNORMAL HIGH (ref 0.0–2.0)
Bicarbonate: 12.6 mmol/L — ABNORMAL LOW (ref 20.0–28.0)
Drawn by: 570601
FIO2: 28
O2 Saturation: 98.3 %
Patient temperature: 36.1
pCO2 arterial: 25 mmHg — ABNORMAL LOW (ref 32.0–48.0)
pH, Arterial: 7.319 — ABNORMAL LOW (ref 7.350–7.450)
pO2, Arterial: 141 mmHg — ABNORMAL HIGH (ref 83.0–108.0)

## 2020-02-04 LAB — LACTIC ACID, PLASMA
Lactic Acid, Venous: 4.4 mmol/L (ref 0.5–1.9)
Lactic Acid, Venous: 2.3 mmol/L (ref 0.5–1.9)

## 2020-02-04 LAB — BETA-HYDROXYBUTYRIC ACID
Beta-Hydroxybutyric Acid: >8 mmol/L — ABNORMAL HIGH (ref 0.05–0.27)
Beta-Hydroxybutyric Acid: >8 mmol/L — ABNORMAL HIGH (ref 0.05–0.27)

## 2020-02-04 LAB — POCT I-STAT EG7
Acid-base deficit: 24 mmol/L — ABNORMAL HIGH (ref 0.0–2.0)
Bicarbonate: 5.9 mmol/L — ABNORMAL LOW (ref 20.0–28.0)
Calcium, Ion: 1.14 mmol/L — ABNORMAL LOW (ref 1.15–1.40)
HCT: 49 % (ref 39.0–52.0)
Hemoglobin: 16.7 g/dL (ref 13.0–17.0)
O2 Saturation: 99 %
Patient temperature: 93
Potassium: 3.8 mmol/L (ref 3.5–5.1)
Sodium: 143 mmol/L (ref 135–145)
TCO2: 7 mmol/L — ABNORMAL LOW (ref 22–32)
pCO2, Ven: 21.3 mmHg — ABNORMAL LOW (ref 44.0–60.0)
pH, Ven: 7.03 — CL (ref 7.250–7.430)
pO2, Ven: 166 mmHg — ABNORMAL HIGH (ref 32.0–45.0)

## 2020-02-04 LAB — TSH: TSH: 0.983 u[IU]/mL (ref 0.350–4.500)

## 2020-02-04 LAB — RAPID URINE DRUG SCREEN, HOSP PERFORMED
Amphetamines: NOT DETECTED
Barbiturates: NOT DETECTED
Benzodiazepines: NOT DETECTED
Cocaine: NOT DETECTED
Opiates: NOT DETECTED
Tetrahydrocannabinol: NOT DETECTED

## 2020-02-04 LAB — PROTIME-INR
INR: 1.1 (ref 0.8–1.2)
Prothrombin Time: 13.6 seconds (ref 11.4–15.2)

## 2020-02-04 LAB — HEPATITIS PANEL, ACUTE
HCV Ab: NONREACTIVE
Hep A IgM: NONREACTIVE
Hep B C IgM: NONREACTIVE
Hepatitis B Surface Ag: NONREACTIVE

## 2020-02-04 LAB — LIPASE, BLOOD: Lipase: 271 U/L — ABNORMAL HIGH (ref 11–51)

## 2020-02-04 LAB — TROPONIN I (HIGH SENSITIVITY): Troponin I (High Sensitivity): 16 ng/L (ref <18)

## 2020-02-04 LAB — ETHANOL: Alcohol, Ethyl (B): <10 mg/dL (ref <10)

## 2020-02-04 LAB — RAPID HIV SCREEN (HIV 1/2 AB+AG)
HIV 1/2 Antibodies: NONREACTIVE
HIV-1 P24 Antigen - HIV24: NONREACTIVE

## 2020-02-04 LAB — HEMOGLOBIN A1C
Hgb A1c MFr Bld: 13.2 % — ABNORMAL HIGH (ref 4.8–5.6)
Mean Plasma Glucose: 332.14 mg/dL

## 2020-02-04 LAB — OCCULT BLOOD GASTRIC / DUODENUM (SPECIMEN CUP): Occult Blood, Gastric: POSITIVE — AB

## 2020-02-04 LAB — PHOSPHORUS: Phosphorus: 6.9 mg/dL — ABNORMAL HIGH (ref 2.5–4.6)

## 2020-02-04 MED ORDER — MAGNESIUM SULFATE 2 GM/50ML IV SOLN
2.0000 g | Freq: Once | INTRAVENOUS | Status: AC
  Administered 2020-02-04: 2 g via INTRAVENOUS
  Filled 2020-02-04: qty 50

## 2020-02-04 MED ORDER — INSULIN REGULAR(HUMAN) IN NACL 100-0.9 UT/100ML-% IV SOLN
INTRAVENOUS | Status: DC
  Administered 2020-02-04: 7.5 [IU]/h via INTRAVENOUS
  Administered 2020-02-05: 8 [IU]/h via INTRAVENOUS
  Filled 2020-02-04 (×2): qty 100

## 2020-02-04 MED ORDER — POTASSIUM CHLORIDE 10 MEQ/100ML IV SOLN
10.0000 meq | INTRAVENOUS | Status: AC
  Administered 2020-02-04 (×2): 10 meq via INTRAVENOUS
  Filled 2020-02-04 (×2): qty 100

## 2020-02-04 MED ORDER — ENOXAPARIN SODIUM 40 MG/0.4ML ~~LOC~~ SOLN
40.0000 mg | SUBCUTANEOUS | Status: DC
  Administered 2020-02-04: 40 mg via SUBCUTANEOUS
  Filled 2020-02-04: qty 0.4

## 2020-02-04 MED ORDER — PANTOPRAZOLE SODIUM 40 MG IV SOLR
40.0000 mg | Freq: Two times a day (BID) | INTRAVENOUS | Status: DC
  Administered 2020-02-05 – 2020-02-11 (×13): 40 mg via INTRAVENOUS
  Filled 2020-02-04 (×13): qty 40

## 2020-02-04 MED ORDER — SODIUM CHLORIDE 0.9 % IV SOLN: INTRAVENOUS | Status: DC

## 2020-02-04 MED ORDER — SODIUM CHLORIDE 0.9 % IV SOLN
2.0000 g | Freq: Two times a day (BID) | INTRAVENOUS | Status: DC
  Administered 2020-02-04 – 2020-02-06 (×4): 2 g via INTRAVENOUS
  Filled 2020-02-04 (×4): qty 2

## 2020-02-04 MED ORDER — DEXTROSE 50 % IV SOLN: 0.0000 mL | INTRAVENOUS | Status: DC | PRN

## 2020-02-04 MED ORDER — SODIUM CHLORIDE 0.9 % IV BOLUS
20.0000 mL/kg | Freq: Once | INTRAVENOUS | Status: AC
  Administered 2020-02-04: 1144 mL via INTRAVENOUS

## 2020-02-04 MED ORDER — VANCOMYCIN HCL IN DEXTROSE 1-5 GM/200ML-% IV SOLN
1000.0000 mg | INTRAVENOUS | Status: DC
  Administered 2020-02-04 – 2020-02-05 (×2): 1000 mg via INTRAVENOUS
  Filled 2020-02-04 (×2): qty 200

## 2020-02-04 MED ORDER — LIDOCAINE HCL URETHRAL/MUCOSAL 2 % EX GEL
1.0000 "application " | Freq: Once | CUTANEOUS | Status: AC
  Administered 2020-02-04: 1 via URETHRAL
  Filled 2020-02-04: qty 20

## 2020-02-04 MED ORDER — POTASSIUM CHLORIDE 10 MEQ/100ML IV SOLN
10.0000 meq | Freq: Once | INTRAVENOUS | Status: AC
  Administered 2020-02-04: 10 meq via INTRAVENOUS
  Filled 2020-02-04: qty 100

## 2020-02-04 MED ORDER — PANTOPRAZOLE SODIUM 40 MG IV SOLR
40.0000 mg | Freq: Every day | INTRAVENOUS | Status: DC
  Administered 2020-02-04: 40 mg via INTRAVENOUS
  Filled 2020-02-04: qty 40

## 2020-02-04 MED ORDER — DEXTROSE-NACL 5-0.45 % IV SOLN: INTRAVENOUS | Status: DC

## 2020-02-04 NOTE — Discharge Summary (Signed)
[facility] Discharge Summary  Patient name: Steven Harvey Medical record number: [id number] Date of birth: 1966/11/08 Age: 54 y.o. Gender: male Date of Admission: 02/04/2020  Date of Discharge: 02/22/2020 Admitting Physician: Kinnie Feil, MD  Primary Care Provider: Patient, No Pcp Per Consultants: None  Indication for Hospitalization: COVID-19 with DKA  Discharge Diagnoses/Problem List:  Intractable nausea with Covid infection and DKA Right upper extremity thrombophlebitis Ascitic anemia Prolonged QTC Hypokalemia Type 2 diabetes AKI Hypertension Hyperlipidemia EtOH abuse/homelessness  Disposition: Back to [facility]  Discharge Condition: Stable  Discharge Exam:  Temp:  [98.1 F (36.7 C)-99.1 F (37.3 C)] 98.6 F (37 C) (03/17 0527) Pulse Rate:  [72-84] 79 (03/17 0527) Resp:  [12-18] 16 (03/17 0527) BP: (122-154)/(78-84) 128/84 (03/17 0527) SpO2:  [97 %-100 %] 100 % (03/17 0527) Physical Exam: General: No acute distress, sleeping when I enter the room easily arouses Cardiovascular: Regular rate and rhythm no murmurs appreciated Respiratory: Clear to auscultation bilaterally, normal breathing Derm: no rashes appreciated Neuro: CN II-XII grossly intact Psych: AO, appropriate affect  Brief Hospital Course:  Steven Harvey is a 54 y.o. male presenting with decreased PO intake, emesis, and hypothermia. PMH is significant for recent COVID infection, T2DM, HTN, HLD, Alcoholism, and homelessness.    DKA  T2DM Was experiencing 8 days of emesis and decreased p.o. intake. In the ED, rectal temp 93.9. HR 124. RR 40. WBC 23. CXR w/ patchy opacities within both lungs, indeterminate but suspicious for infection. EKG w/ sinus tach and prolonged QTc 523. LA 4.4. Lipase 271. VBG pH 7, pCO2 21, pO2 166, bicarb 5.9. Glu >700. Hgb 13.2. BHB acid >8. Hepatitis panel negative. He was started on insulin gtt per endo tool protocol and given 1L NS bolus x2.  Anion gap closed x2 and was transitioned off endo tool but remained on dextrose containing fluids and n.p.o. due to poor mental status and concern for aspiration.  Patient continued to have nausea and epigastric pain, unable to tolerate p.o. for several days.  Diet was gradually increased and patient eventually was able to tolerate a regular carb modified diet.  Patient identified as having protein calorie malnutrition.  Dietitian was consulted and recommended that patient receive Glucerna shakes 4 times daily and prostat.  When blood sugars normalized, patient started on oral regimen of glipizide 5 mg daily and Metformin 1000 mg daily.  Lantus was discontinued as patient was homeless, and would benefit from an oral regimen.  Was planning on discharging the patient on Metformin ER 1000 mg daily.  Due to national shortage and discussions with pharmacy this was unavailable.  Discharged patient on Metformin 500 mg twice daily.  Recommend switching to 1000 mg extended release daily when available given it will probably improve compliance as well as decrease GI side effects.  Hematemasis 2/27. Episode of vomiting, producing coffee-ground-appearing emesis. Lovenox was discontinued. Started 40 mg twice daily Protonix.  Hematemesis resolved and hemoglobin was stable throughout hospitalization.  Hemoglobin prior to discharge was 9.4.  COVID  SIRS Admitted to the [place] unit. Presented w/ 4/4 SIRS criteria. Recently test positive for COVID 01/28/2020. Initial rectal temp 93.9. HR 124. RR 40. PCO2 21. WBC 23. CXR w/ patchy opacities within both lungs, indeterminate but suspicious for infection. LA 4.4. S/p 1L NS bolus x2. Started on IV vancomycin and cefepime. Blood culture did not grow anything and antibiotics were discontinued. He was started on remdesivir 2/28.  Patient's respiratory status improved and he completed his remdesivir on  3/4.  Patient was Covid free and cleared to stepdown out of the Covid unit on  3/14.  Cachexia  malnutrition  BMI 19.73. albumin 3.2.  Admission patient had not been able to tolerate anything by mouth for 8 days, and was unable to tolerate anything by mouth for 6 days after admission due to nausea and epigastric pain.  Diet was gradually increased and patient eventually was able to tolerate a regular carb modified diet.  Patient identified as having protein calorie malnutrition.  Dietitian was consulted and recommended that patient receive Glucerna shake 4 times daily at time of discharge.  Normocytic anemia- hgb 14.6 on admission (2/27) and has gradually declined throughout stay, hgb now 9.2. Most likely dilutional since patient received prolonged course of IV fluids while not able to tolerate PO. Suspect the new numbers are more accurate to patient's true level. Fe is low, MCV normal, otherwise anemia panel WNL. Ferritin elevated due to recent COVID-19 illness.  Daily iron supplement prescribed.  Last CBC prior to discharge showed hemoglobin of 9.4.  Patient should continue iron supplementation.  Prolonged QTc- 525 3/6 and stable. Avoid QTc prolonging agents.  Likely due to electrolyte abnormalities secondary to sepsis and DKA.  Recommend following to ensure resolution.    Issues for Follow Up:  1. Diabetes-recommend Metformin ER 1000 mg daily.  Unable to prescribe at discharge due to national shortage and uncertainty on availability for patient outpatient. 2. Hematemesis: Only 2 episodes, was not repeated in the hospital after initial episodes.  GI recommended outpatient colonoscopy.  3. Malnutrition: Dietitian recommendations 4. Anemia: Recommend intermittent monitoring, continue daily iron supplement 5. Prolonged QTC: Recommend intermittent monitoring to ensure resolution.  Significant Procedures:   Significant Labs and Imaging:  Recent Labs  Lab 02/17/20 1010 02/18/20 0331 02/20/20 0851  WBC 6.2 6.9 5.5  HGB 9.1* 9.2* 9.4*  HCT 29.4* 29.6* 30.1*  PLT 232 228  245   Recent Labs  Lab 02/16/20 0417 02/16/20 0417 02/17/20 1010 02/17/20 1010 02/18/20 0331 02/20/20 0851  NA 141  --  139  --  140 141  K 3.6   < > 3.8   < > 3.6 3.8  CL 105  --  103  --  104 103  CO2 27  --  28  --  27 25  GLUCOSE 190*  --  202*  --  180* 104*  BUN <5*  --  5*  --  <5* 7  CREATININE 0.76  --  0.82  --  0.78 0.88  CALCIUM 7.7*  --  7.8*  --  8.0* 8.2*   < > = values in this interval not displayed.     Results/Tests Pending at Time of Discharge: None  Discharge Medications:  Allergies as of 02/22/2020   No Known Allergies     Medication List    STOP taking these medications   ibuprofen 400 MG tablet Commonly known as: ADVIL   naproxen 500 MG tablet Commonly known as: NAPROSYN     TAKE these medications   blood glucose meter kit and supplies Kit Dispense based on patient and insurance preference. Use up to four times daily as directed. (FOR ICD-9 250.00, 250.01). What changed: Another medication with the same name was added. Make sure you understand how and when to take each. Notes to patient: Follow MD directions    blood glucose meter kit and supplies Kit Dispense based on patient and insurance preference. Use up to four times daily as directed. (FOR  ICD-9 250.00, 250.01). What changed: You were already taking a medication with the same name, and this prescription was added. Make sure you understand how and when to take each. Notes to patient: Follow MD instructions    glipiZIDE 5 MG 24 hr tablet Commonly known as: GLUCOTROL XL Take 1 tablet (5 mg total) by mouth daily with breakfast. Start taking on: February 23, 2020 Notes to patient: 02/23/2020   lisinopril 5 MG tablet Commonly known as: ZESTRIL Take 1 tablet (5 mg total) by mouth daily. Notes to patient: 02/23/2020   metFORMIN 500 MG tablet Commonly known as: Glucophage Take 1 tablet (500 mg total) by mouth 2 (two) times daily with a meal. What changed:   medication strength  how  much to take  when to take this Notes to patient: 02/22/2020   ondansetron 4 MG disintegrating tablet Commonly known as: Zofran ODT Take 1 tablet (4 mg total) by mouth every 8 (eight) hours as needed for nausea or vomiting.   simvastatin 20 MG tablet Commonly known as: ZOCOR Take 1 tablet (20 mg total) by mouth daily. Notes to patient: 02/23/2020   TYLENOL PO Take 1 tablet by mouth daily as needed (pain/fever/headache). What changed: Another medication with the same name was removed. Continue taking this medication, and follow the directions you see here.       Discharge Instructions: Please refer to Patient Instructions section of EMR for full details.  Patient was counseled important signs and symptoms that should prompt return to medical care, changes in medications, dietary instructions, activity restrictions, and follow up appointments.   Follow-Up Appointments:   Gifford Shave, MD 02/22/2020, 2:00 PM PGY-1, [place]

## 2020-02-04 NOTE — ED Notes (Signed)
Bair hugger applied to patient.

## 2020-02-04 NOTE — Progress Notes (Signed)
Pt HR up to 151 Family Medicine paged, [facility] called in room and RT and CN made aware of changes. Dr. Dareen Piano with Family medicine to come see patient. Patient resperation 30's to 40's HR 148-151. SBP slowing dropping per chart. With Dr. Dareen Piano at bedside patient had coffee ground emesis episode vomiting 100 ml of emesis. MD to place order for gastro cult and CBC.

## 2020-02-04 NOTE — ED Notes (Signed)
I Stat Chem 8 result for a Glucose of >700, and I Stat VBG fesult for Ph of 6.992 reported to Dr. Donnald Garre.

## 2020-02-04 NOTE — ED Provider Notes (Signed)
[facility] EMERGENCY DEPARTMENT Provider Note   CSN: [id number] Arrival date & time: 02/04/20  1154     History No chief complaint on file.   Arieon Corcoran is a 54 y.o. male.  HPI Patient has been staying at a motel after being diagnosed with Covid.  He has been there about 10 days.  Patient reports that he has been having daily vomiting.  He has not had any of his diabetic medications.  He cannot remember the last time he took anything for his diabetes.  He reports he can only drink small amounts of water intermittently.  He reports he has gotten very weak.  He denies he has any focal pain.  He has had some ongoing coughing but denies he has been significantly short of breath.  EMS reports that patient's blood pressure is 90/40 on arrival.  They did administer 500 cc of normal saline with improvement to 110/68.  EMS also reports about a 3-second episodes of V. tach on route.  Patient denies any chest pain or palpitations.  Patient is alert and giving full history.    Past Medical History:  Diagnosis Date  . Diabetes mellitus without complication [facility])     Patient Active Problem List   Diagnosis Date Noted  . DKA (diabetic ketoacidoses) ([place]) 02/04/2020  . Diabetes ([place]) 12/05/2019  . Essential hypertension 12/05/2019  . HLD (hyperlipidemia) 12/05/2019  . Pain in joint, shoulder region 12/05/2019  . Diabetic neuropathy ([place]) 12/05/2019  . Alcoholism /alcohol abuse ([place]) 12/05/2019    No past surgical history on file.     Family History  Problem Relation Age of Onset  . Heart failure Mother   . Healthy Father     Social History   Tobacco Use  . Smoking status: Former Smoker    Types: Cigarettes  . Smokeless tobacco: Never Used  Substance Use Topics  . Alcohol use: Not Currently    Comment: recently quit  . Drug use: Not on file    Home Medications Prior to Admission medications   Medication Sig Start Date End Date Taking? Authorizing  Provider  Acetaminophen 325 MG CAPS Take 650 mg by mouth every 8 (eight) hours as needed. 01/28/20   Quintella Reichert, MD  blood glucose meter kit and supplies KIT Dispense based on patient and insurance preference. Use up to four times daily as directed. (FOR ICD-9 250.00, 250.01). 12/05/19   Raylene Everts, MD  ibuprofen (ADVIL) 400 MG tablet Take 1 tablet (400 mg total) by mouth every 8 (eight) hours as needed. 01/28/20   Quintella Reichert, MD  lisinopril (ZESTRIL) 5 MG tablet Take 1 tablet (5 mg total) by mouth daily. 01/28/20   Quintella Reichert, MD  metFORMIN (GLUCOPHAGE) 1000 MG tablet Take 1 tablet (1,000 mg total) by mouth 2 (two) times daily. 01/28/20   Quintella Reichert, MD  naproxen (NAPROSYN) 500 MG tablet Take 1 tablet (500 mg total) by mouth 2 (two) times daily. 12/05/19   Raylene Everts, MD  ondansetron (ZOFRAN ODT) 4 MG disintegrating tablet Take 1 tablet (4 mg total) by mouth every 8 (eight) hours as needed for nausea or vomiting. 01/28/20   Quintella Reichert, MD  simvastatin (ZOCOR) 20 MG tablet Take 1 tablet (20 mg total) by mouth daily. 12/05/19   Raylene Everts, MD    Allergies    Patient has no known allergies.  Review of Systems   Review of Systems 10 Systems reviewed and are negative for acute change  except as noted in the HPI.  Physical Exam Updated Vital Signs BP 129/86   Pulse (!) 116   Temp (!) 93.9 F (34.4 C) (Rectal)   Resp (!) 32   Ht '5\' 7"'$  (1.702 m)   Wt 57.2 kg   SpO2 100%   BMI 19.73 kg/m   Physical Exam Constitutional:      Comments: Patient smells strongly of ketones.  He is extremely thin and cachectic.  No respiratory distress.  Patient is alert and oriented x3 answering questions.  HENT:     Head: Normocephalic and atraumatic.     Mouth/Throat:     Comments: Mouth is dry.  Poor dentition. Eyes:     Extraocular Movements: Extraocular movements intact.     Pupils: Pupils are equal, round, and reactive to light.  Cardiovascular:      Comments: Borderline tachycardia.  No gross rub murmur gallop. Pulmonary:     Comments: Tachypnea.  Patient does not appear to be in respiratory distress however.  He is speaking clearly in full sentences.  Lungs are clear. Abdominal:     General: There is no distension.     Palpations: Abdomen is soft.     Tenderness: There is no abdominal tenderness. There is no guarding.  Musculoskeletal:     Cervical back: Neck supple.     Comments: Extremities are cachectic.  No wounds or swelling.  No cellulitis.  Skin:    General: Skin is warm and dry.  Neurological:     General: No focal deficit present.     Mental Status: He is oriented to person, place, and time.     Cranial Nerves: No cranial nerve deficit.     Coordination: Coordination normal.  Psychiatric:        Mood and Affect: Mood normal.     ED Results / Procedures / Treatments   Labs (all labs ordered are listed, but only abnormal results are displayed) Labs Reviewed  LIPASE, BLOOD - Abnormal; Notable for the following components:      Result Value   Lipase 271 (*)    All other components within normal limits  LACTIC ACID, PLASMA - Abnormal; Notable for the following components:   Lactic Acid, Venous 4.4 (*)    All other components within normal limits  CBC WITH DIFFERENTIAL/PLATELET - Abnormal; Notable for the following components:   WBC 23.8 (*)    Neutro Abs 18.0 (*)    Monocytes Absolute 2.6 (*)    Basophils Absolute 0.2 (*)    Abs Immature Granulocytes 1.05 (*)    All other components within normal limits  PHOSPHORUS - Abnormal; Notable for the following components:   Phosphorus 6.9 (*)    All other components within normal limits  BASIC METABOLIC PANEL - Abnormal; Notable for the following components:   CO2 <7 (*)    Glucose, Bld 725 (*)    BUN 40 (*)    Creatinine, Ser 2.59 (*)    GFR calc non Af Amer 27 (*)    GFR calc Af Amer 31 (*)    All other components within normal limits  BETA-HYDROXYBUTYRIC ACID -  Abnormal; Notable for the following components:   Beta-Hydroxybutyric Acid >8.00 (*)    All other components within normal limits  HEPATIC FUNCTION PANEL - Abnormal; Notable for the following components:   Albumin 3.2 (*)    Total Bilirubin 1.9 (*)    All other components within normal limits  HEMOGLOBIN A1C - Abnormal; Notable for  the following components:   Hgb A1c MFr Bld 13.2 (*)    All other components within normal limits  CBG MONITORING, ED - Abnormal; Notable for the following components:   Glucose-Capillary >600 (*)    All other components within normal limits  I-STAT CHEM 8, ED - Abnormal; Notable for the following components:   Chloride 113 (*)    BUN 39 (*)    Creatinine, Ser 1.70 (*)    Glucose, Bld >700 (*)    TCO2 8 (*)    All other components within normal limits  POCT I-STAT EG7 - Abnormal; Notable for the following components:   pH, Ven 7.030 (*)    pCO2, Ven 21.3 (*)    pO2, Ven 166.0 (*)    Bicarbonate 5.9 (*)    TCO2 7 (*)    Acid-base deficit 24.0 (*)    Calcium, Ion 1.14 (*)    All other components within normal limits  CULTURE, BLOOD (ROUTINE X 2)  CULTURE, BLOOD (ROUTINE X 2)  URINE CULTURE  ETHANOL  PROTIME-INR  TSH  RAPID HIV SCREEN (HIV 1/2 AB+AG)  HEPATITIS PANEL, ACUTE  LACTIC ACID, PLASMA  URINALYSIS, ROUTINE W REFLEX MICROSCOPIC  RAPID URINE DRUG SCREEN, HOSP PERFORMED  BLOOD GAS, VENOUS  BASIC METABOLIC PANEL  BASIC METABOLIC PANEL  BASIC METABOLIC PANEL  BETA-HYDROXYBUTYRIC ACID  RPR  CBG MONITORING, ED  TROPONIN I (HIGH SENSITIVITY)    EKG EKG Interpretation  Date/Time:  Saturday February 04 2020 12:17:09 EST Ventricular Rate:  121 PR Interval:    QRS Duration: 135 QT Interval:  368 QTC Calculation: 523 R Axis:   85 Text Interpretation: Sinus tachycardia extensive artifact. anterior t waves peaked compared to previois. no acute ischemic appearance but a lot of inferior artifact Confirmed by Charlesetta Shanks [phone]) on  02/04/2020 4:20:14 PM   Radiology DG Chest 1 View  Result Date: 02/04/2020 CLINICAL DATA:  Shortness of breath. COVID positive. EXAM: CHEST  1 VIEW COMPARISON:  01/28/2020 FINDINGS: Patchy opacities within both mid and lower lungs are noted and suspicious for infection. The cardiomediastinal silhouette is unremarkable. No pleural effusion, mass or pneumothorax. No acute bony abnormalities are identified. IMPRESSION: Patchy opacities within both lungs, indeterminate but suspicious for infection. Electronically Signed   By: Margarette Canada M.D.   On: 02/04/2020 13:03    Procedures Procedures (including critical care time) CRITICAL CARE Performed by: Charlesetta Shanks   Total critical care time:30 minutes  Critical care time was exclusive of separately billable procedures and treating other patients.  Critical care was necessary to treat or prevent imminent or life-threatening deterioration.  Critical care was time spent personally by me on the following activities: development of treatment plan with patient and/or surrogate as well as nursing, discussions with consultants, evaluation of patient's response to treatment, examination of patient, obtaining history from patient or surrogate, ordering and performing treatments and interventions, ordering and review of laboratory studies, ordering and review of radiographic studies, pulse oximetry and re-evaluation of patient's condition. Medications Ordered in ED Medications  insulin regular, human (MYXREDLIN) 100 units/ 100 mL infusion (has no administration in time range)  0.9 %  sodium chloride infusion (has no administration in time range)  dextrose 5 %-0.45 % sodium chloride infusion (has no administration in time range)  dextrose 50 % solution 0-50 mL (has no administration in time range)  potassium chloride 10 mEq in 100 mL IVPB (has no administration in time range)  enoxaparin (LOVENOX) injection 40 mg (has no administration  in time range)    ceFEPIme (MAXIPIME) 2 g in sodium chloride 0.9 % 100 mL IVPB (has no administration in time range)  vancomycin (VANCOCIN) IVPB 1000 mg/200 mL premix (has no administration in time range)  magnesium sulfate IVPB 2 g 50 mL (0 g Intravenous Stopped 02/04/20 1505)  sodium chloride 0.9 % bolus 1,144 mL (0 mL/kg  57.2 kg Intravenous Stopped 02/04/20 1521)  potassium chloride 10 mEq in 100 mL IVPB (0 mEq Intravenous Stopped 02/04/20 1606)    ED Course  I have reviewed the triage vital signs and the nursing notes.  Pertinent labs & imaging results that were available during my care of the patient were reviewed by me and considered in my medical decision making (see chart for details).  Clinical Course as of Feb 03 1619  Sat Feb 04, 2020  1406 Consult: Family medicine service for admission.   [MP]    Clinical Course User Index [MP] Charlesetta Shanks, MD   MDM Rules/Calculators/A&P                     Patient presents as outlined above.  Predominant issue today appears to be DKA.  He is hypothermic and tachycardic.  Blood pressure has responded quickly to mild fluid resuscitation by EMS.  I have added 20 cc/kg fluid bolus and initiated insulin drip.  Patient was positively diagnosed with Covid a little over a week ago.  Chest x-ray does show patchy infiltrate.  He is not objectively short of breath.  Lung sounds are actually clear and he does not have any hypoxia requiring oxygen.  Infiltrates may be the residual of his Covid.  Patient however may have secondary pneumonia.  Given his clear mental status and blood pressure normalization with only small fluid bolus,  no hypoxia I have higher suspicion for infiltrates being residual from Covid rather than acute bacterial pneumonia.  At this time, empiric antibiotics have not been initiated.  Have reviewed with admitting team who will determine empiric antibiotic treatment.  At this time, patient is showing improvement with treatment.  Patient will be admitted  to family medicine service. Final Clinical Impression(s) / ED Diagnoses Final diagnoses:  SOB (shortness of breath)  Diabetic ketoacidosis without coma associated with type 2 diabetes mellitus ([place])  COVID-19    Rx / DC Orders ED Discharge Orders    None       Charlesetta Shanks, MD 02/04/20 1625

## 2020-02-04 NOTE — Progress Notes (Addendum)
Interim Progress Note:  Received page from RN [place], noted change in patient's vitals, HR steadily increasing 119>150, EKG read Atrial flutter but appears to be sinus tachycardia. Blood pressure steadily decreasing, highest BP 135/93, most recently 112/83.  Respiratory rate remains elevated in the 30s, temperature has appropriately increased from 93.9 F up to 97 F after [employer] I reached out to [facility] from pharmacy, who recommended against beta-blockers to control heart rate as patient is currently compensating.  All the proper measures have been taken, including IV fluids and insulin for DKA, vancomycin started at 2012, cefepime to follow.  - Patient started on 2L [place] O2 for increased work of breathing. - Continue Lawyer as needed - Recommend AGAINST Beta-blockers at this time for sinus tachy - Will reach out to CCM should vitals continue to drop despite resuscitation.   While in the room, I attempted to speak with patient.  He will open his eyes on command, is very fatigued and weak, falls back to sleep easily, has difficulty moving any part of his body, very cachectic appearing.  While trying to speak patient experienced an episode of vomiting, producing coffee-ground-appearing emesis.  BUN elevated at 39 and 40.   - Will order Gastroccult, stat CBC, stat ABG, and Protonix.  - Patient remains NPO - If Gastroccult returns positive will plan to CONSULT GI and STOP LOVENOX.  RN [place] also noted patient has lower abdomen tenderness and distention, ordered bladder scan.  Condom cath in place without any urinary output. Bladder scan >935mL  - ordered In&Out Cath x1 - was unsuccessful x2 with straight cath - Will order Indwelling catheter  Thank you [place] and Christiane Ha for your hard work!!!   Peggyann Shoals, DO [facility], PGY-2 02/04/2020 9:23 PM

## 2020-02-04 NOTE — H&P (Addendum)
[facility] Admission History and Physical Service Pager: [phone]  Patient name: Steven Harvey Medical record number: [id number] Date of birth: 1966/06/12 Age: 54 y.o. Gender: male  Primary Care Provider: Patient, No Pcp Per Consultants: None Code Status: FULL Preferred Emergency Contact: Brother Vivia Ewing [place]) [phone]  Chief Complaint: decreased PO intake and emesis  Assessment and Plan: Wasil Wolke is a 54 y.o. male presenting with decreased PO intake, emesis, and hypothermia. PMH is significant for recent COVID infection, T2DM, HTN, HLD, Alcoholism, and homelessness.   Decreased PO intake, emesis, hypothermia  DKA  T2DM Acute, stable and slightly improving.He has experienced 8 days of emesis and decreased p.o. intake stating his last meal was 7 days ago. Continues to endorse cough with recent covid infection but denies any chest pain, SOB, or abdominal pain. He denies fever, confusion, but has dizziness when he doesn't drink water. He was found in a hotel that he was staying for COVID isolation. He mainly stays in a group home. He was brought to the ED via EMS but cannot remember who called EMS. In the ED, rectal temp 93.9. HR 124. RR 40. WBC 23. CXR w/ patchy opacities within both lungs, indeterminate but suspicious for infection. EKG w/ sinus tach and prolonged QTc 523. LA 4.4. Lipase 271. VBG pH 7, pCO2 21, pO2 166, bicarb 5.9. Glu >700. Hgb 13.2. BHB acid >8. Hepatitis panel negative. He was started on insulin gtt per endo tool protocol and given 1L NS bolus x2. On exam he appears to be unwell and fluid down continuing to endorse increased thirst. He is cachetic; weighs 57kg. Etiology of presentation likely due to prolonged uncontrolled diabetes (home medication only metformin '1000mg'$  BID) exacerbated by an acute infectious process.  -Admit to FPTS, Progressive. Attending Dr. Gwendlyn Deutscher -NPO until off insulin gtt -f/u repeat EKG -Insulin gtt and CBGs  transition per endo tool; KCl 46mq q1h x2 doses -BMP q4h/ BHB q8h -f/u UA, urine/blood cx -f/u UDS -Continue D51/2NS 125 mL/h, give 1L bolus x1 -recheck temp; vital signs per protocol -continuous cardiac monitoring and pulse ox -Zofran following repeat EKG w/o QTc prolongation; Consider compazine   Sepsis v. COVID infection sequela Recently test positive for COVID 01/28/2020. Continues to endorse cough but denies any chest pain, SOB, or abdominal pain. He denies fever, confusion, but has dizziness when he doesn't drink water. He has experienced 8 days of emesis and decreased p.o. intake stating his last meal was 7 days ago. Initial rectal temp 93.9. HR 124. RR 40. PCO2 21.WBC 23. CXR w/ patchy opacities within both lungs, indeterminate but suspicious for infection. LA 4.4.  Needs criteria for very sepsis. S/p 1L NS bolus x2. He is ill appearing and appears to be very dehydrated. He is CTAB w/o increased work of breathing. Satting 100% on RA. He is endorsing increased thirst and the need to drink water. Etiology of source of infection is unclear at this time. Blood and urine cultures are pending. Hepatitis panel negative. Likely due to COVID but could very well be a different source and/or exacerbated by his DKA or vice versa. -Isolation precaution (Airborne and contact) -Start IV vancomycin and cefepime per pharmacy; narrow when culture/sensivities available. -Give 1L NS bolus x1; continue NS 1271mh -f/u Blood & urine cultures -recheck temperature -f/u repeat EKG -cardiac monitoring, pulse ox  AKI Acute, stable. Patient with serum creatnine of 2.59 on admission with baseline of 1.08 from previous admissions. Almost surely this is prerenal secondary to dehydration due to  DKA. He has had 3L of NS and is on IVFs above his maintenance rate to replace losses. - Cont IVFs - BMPs q4 while on Endotool - avoiding nephrotoxic medications  HTN BP 122/78. Endorses medication compliance with Lisinopril  '5mg'$  daily. -Hold home meds while NPO  HLD No lipid panel on file.  -f/u AM Lipid panel  Alcoholism Endorses last alcoholic beverage was 4 months ago.  AST/ALT 15/12. -CIWA w/o ativan  Homelessness Currently living out of a motel due to isolating for Covid.  Was previously in a group home. -TOC consult   FEN/GI: Zofran (hold for repeat EKG) Prophylaxis: Lovenox  Disposition: Progressive, FPTS  History of Present Illness:  Steven Harvey is a 54 y.o. male presenting with 8 days of nausea and vomiting. He states he is only on Metformin and he was taking this everyday. He states he is coughing still from [place] but denies any chest pain, SOB, or abdominal pain. He denies fever, confusion, but has dizziness when he doesn't drink water. He denies any alcohol use for almost 4 months. He says he hasn't been able to eat or anything for 7 days. He has been drinking every day. He is thirsty.   Review Of Systems: Per HPI with the following additions:   Review of Systems  Constitutional: Positive for malaise/fatigue. Negative for fever.  HENT: Negative for congestion and sinus pain.   Respiratory: Positive for cough. Negative for sputum production, shortness of breath and wheezing.   Cardiovascular: Negative for chest pain, palpitations, leg swelling and PND.  Gastrointestinal: Positive for nausea and vomiting. Negative for abdominal pain, constipation and diarrhea.  Genitourinary: Negative for dysuria, frequency and urgency.  Neurological: Positive for dizziness. Negative for focal weakness, seizures, loss of consciousness, weakness and headaches.   Patient Active Problem List   Diagnosis Date Noted  . Diabetes ([place]) 12/05/2019  . Essential hypertension 12/05/2019  . HLD (hyperlipidemia) 12/05/2019  . Pain in joint, shoulder region 12/05/2019  . Diabetic neuropathy ([place]) 12/05/2019  . Alcoholism /alcohol abuse ([place]) 12/05/2019   Past Medical History: Past Medical History:  Diagnosis  Date  . Diabetes mellitus without complication [facility])    Past Surgical History: No past surgical history on file.  Social History: Social History   Tobacco Use  . Smoking status: Former Smoker    Types: Cigarettes  . Smokeless tobacco: Never Used  Substance Use Topics  . Alcohol use: Not Currently    Comment: recently quit  . Drug use: Not on file   Family History: Family History  Problem Relation Age of Onset  . Heart failure Mother   . Healthy Father    Allergies and Medications: No Known Allergies No current facility-administered medications on file prior to encounter.   Current Outpatient Medications on File Prior to Encounter  Medication Sig Dispense Refill  . Acetaminophen 325 MG CAPS Take 650 mg by mouth every 8 (eight) hours as needed. 30 capsule 0  . blood glucose meter kit and supplies KIT Dispense based on patient and insurance preference. Use up to four times daily as directed. (FOR ICD-9 250.00, 250.01). 1 each 0  . ibuprofen (ADVIL) 400 MG tablet Take 1 tablet (400 mg total) by mouth every 8 (eight) hours as needed. 30 tablet 0  . lisinopril (ZESTRIL) 5 MG tablet Take 1 tablet (5 mg total) by mouth daily. 30 tablet 0  . metFORMIN (GLUCOPHAGE) 1000 MG tablet Take 1 tablet (1,000 mg total) by mouth 2 (two) times daily. 30 tablet  0  . naproxen (NAPROSYN) 500 MG tablet Take 1 tablet (500 mg total) by mouth 2 (two) times daily. 30 tablet 0  . ondansetron (ZOFRAN ODT) 4 MG disintegrating tablet Take 1 tablet (4 mg total) by mouth every 8 (eight) hours as needed for nausea or vomiting. 12 tablet 0  . simvastatin (ZOCOR) 20 MG tablet Take 1 tablet (20 mg total) by mouth daily. 30 tablet 1    Objective: BP 132/90   Pulse (!) 113   Temp (!) 93.9 F (34.4 C) (Rectal)   Resp (!) 27   Ht '5\' 7"'$  (1.702 m)   Wt 57.2 kg   SpO2 100%   BMI 19.73 kg/m  Exam:  General: Appears unwell, no acute distress. Age appropriate.  Cardiac: RRR, normal heart sounds, no  murmurs Respiratory: CTAB, normal effort Abdomen: soft, nontender, nondistended, +BS Extremities: No edema or cyanosis. Skin: Warm and dry, dry cracked scaling feet Neuro: alert and oriented, no focal deficits  Labs and Imaging: CBC BMET  Recent Labs  Lab 02/04/20 1234 02/04/20 1234 02/04/20 1257  WBC 23.8*  --   --   HGB 15.8   < > 17.0  16.7  HCT 51.3   < > 50.0  49.0  PLT 330  --   --    < > = values in this interval not displayed.   Recent Labs  Lab 02/04/20 1234 02/04/20 1234 02/04/20 1257  NA 145   < > 143  143  K 4.2   < > 3.8  3.8  CL 105   < > 113*  CO2 <7*  --   --   BUN 40*   < > 39*  CREATININE 2.59*   < > 1.70*  GLUCOSE 725*   < > >700*  CALCIUM 9.1  --   --    < > = values in this interval not displayed.     EKG: Sinus tachycardia, a lot of artifact and prolong QTc 523. Repeat pending  CHEST  1 VIEW COMPARISON:  01/28/2020 IMPRESSION: Patchy opacities within both lungs, indeterminate but suspicious for infection.   Gerlene Fee, DO 02/04/2020, 2:00 PM PGY-1, [facility] Intern pager: [phone], text pages welcome  Resident Attestation   I saw and evaluated the patient, performing the key elements of the service. I personally performed or re-performed the history, physical exam, and medical decision making activities of this service and have verified that the service and findings are accurately documented in the resident's note. I developed the management plan that is described in the resident's note, and I agree with the content, with my edits above in red.   Harolyn Rutherford, DO [facility], PGY-3

## 2020-02-04 NOTE — Progress Notes (Signed)
Pharmacy Antibiotic Note  Steven Harvey is a 54 y.o. male admitted on 02/04/2020 with sepsis.  Pharmacy has been consulted for Cefepime and Vancomycin dosing.  Height: 5\' 7"  (170.2 cm) Weight: 126 lb (57.2 kg) IBW/kg (Calculated) : 66.1  Temp (24hrs), Avg:93.9 F (34.4 C), Min:93.9 F (34.4 C), Max:93.9 F (34.4 C)  Recent Labs  Lab 02/04/20 1234 02/04/20 1257  WBC 23.8*  --   CREATININE 2.59* 1.70*  LATICACIDVEN 4.4*  --     Estimated Creatinine Clearance: 40.7 mL/min (A) (by C-G formula based on SCr of 1.7 mg/dL (H)).    No Known Allergies  Antimicrobials this admission: 2/27 Cefepime >>  2/27 Vancomycin >>   Dose adjustments this admission:   Microbiology results: Pending   Plan:  - DKA + sepsis, two different Scr reported (2.5 lab vs 1.7 Istat)  - Cefepime 2g IV q12h  - Vancomycin 1000 mg IV q24h (based on 1.7 Scr)  - Est calc AUC 559 - Monitor patients renal function and urine output   Thank you for allowing pharmacy to be a part of this patient's care.  3/27 PharmD. BCPS  02/04/2020 4:00 PM

## 2020-02-04 NOTE — Plan of Care (Signed)

## 2020-02-04 NOTE — ED Notes (Signed)
Pt found to have taken off bair hugger, removed cardiac monitor, zoll pads and pulled out IV in the Left AC. Pt reoriented to call bell use, placed back on monitor and gauze applied to site where IV was removed from.

## 2020-02-04 NOTE — ED Triage Notes (Signed)
Pt BIB [facility] from a motel where he is staying due to being diagnosed with COVID about 10 days. EMS reports patient was found after having several episodes of vomiting, increased weakness, lethargy, decreased PO intake. Pt denying any pain upon ED arrival. EMS reports initial bp of 90/40 and then 110/68 after 500cc of NSS. EMS also reports patient had about a 3 second episode of VTach enroute to the ED. Pt is alert and oriented x4 at present time denying any pain.

## 2020-02-05 DIAGNOSIS — R0602 Shortness of breath: Secondary | ICD-10-CM

## 2020-02-05 LAB — RPR: RPR Ser Ql: NONREACTIVE

## 2020-02-05 LAB — BASIC METABOLIC PANEL
Anion gap: 12 (ref 5–15)
Anion gap: 10 (ref 5–15)
Anion gap: 9 (ref 5–15)
BUN: 32 mg/dL — ABNORMAL HIGH (ref 6–20)
BUN: 29 mg/dL — ABNORMAL HIGH (ref 6–20)
BUN: 26 mg/dL — ABNORMAL HIGH (ref 6–20)
CO2: 17 mmol/L — ABNORMAL LOW (ref 22–32)
CO2: 18 mmol/L — ABNORMAL LOW (ref 22–32)
CO2: 17 mmol/L — ABNORMAL LOW (ref 22–32)
Calcium: 9 mg/dL (ref 8.9–10.3)
Calcium: 8.7 mg/dL — ABNORMAL LOW (ref 8.9–10.3)
Calcium: 8.6 mg/dL — ABNORMAL LOW (ref 8.9–10.3)
Chloride: 123 mmol/L — ABNORMAL HIGH (ref 98–111)
Chloride: 124 mmol/L — ABNORMAL HIGH (ref 98–111)
Chloride: 126 mmol/L — ABNORMAL HIGH (ref 98–111)
Creatinine, Ser: 1.65 mg/dL — ABNORMAL HIGH (ref 0.61–1.24)
Creatinine, Ser: 1.53 mg/dL — ABNORMAL HIGH (ref 0.61–1.24)
Creatinine, Ser: 1.47 mg/dL — ABNORMAL HIGH (ref 0.61–1.24)
GFR calc Af Amer: 54 mL/min — ABNORMAL LOW (ref >60)
GFR calc Af Amer: 59 mL/min — ABNORMAL LOW (ref >60)
GFR calc Af Amer: >60 mL/min (ref >60)
GFR calc non Af Amer: 47 mL/min — ABNORMAL LOW (ref >60)
GFR calc non Af Amer: 51 mL/min — ABNORMAL LOW (ref >60)
GFR calc non Af Amer: 54 mL/min — ABNORMAL LOW (ref >60)
Glucose, Bld: 272 mg/dL — ABNORMAL HIGH (ref 70–99)
Glucose, Bld: 217 mg/dL — ABNORMAL HIGH (ref 70–99)
Glucose, Bld: 207 mg/dL — ABNORMAL HIGH (ref 70–99)
Potassium: 3.4 mmol/L — ABNORMAL LOW (ref 3.5–5.1)
Potassium: 4.1 mmol/L (ref 3.5–5.1)
Potassium: 3.7 mmol/L (ref 3.5–5.1)
Sodium: 152 mmol/L — ABNORMAL HIGH (ref 135–145)
Sodium: 152 mmol/L — ABNORMAL HIGH (ref 135–145)
Sodium: 152 mmol/L — ABNORMAL HIGH (ref 135–145)

## 2020-02-05 LAB — COMPREHENSIVE METABOLIC PANEL
ALT: 10 U/L (ref 0–44)
AST: 15 U/L (ref 15–41)
Albumin: 2.4 g/dL — ABNORMAL LOW (ref 3.5–5.0)
Alkaline Phosphatase: 61 U/L (ref 38–126)
Anion gap: 12 (ref 5–15)
BUN: 22 mg/dL — ABNORMAL HIGH (ref 6–20)
CO2: 16 mmol/L — ABNORMAL LOW (ref 22–32)
Calcium: 8.3 mg/dL — ABNORMAL LOW (ref 8.9–10.3)
Chloride: 122 mmol/L — ABNORMAL HIGH (ref 98–111)
Creatinine, Ser: 1.38 mg/dL — ABNORMAL HIGH (ref 0.61–1.24)
GFR calc Af Amer: >60 mL/min (ref >60)
GFR calc non Af Amer: 58 mL/min — ABNORMAL LOW (ref >60)
Glucose, Bld: 348 mg/dL — ABNORMAL HIGH (ref 70–99)
Potassium: 3.6 mmol/L (ref 3.5–5.1)
Sodium: 150 mmol/L — ABNORMAL HIGH (ref 135–145)
Total Bilirubin: 0.6 mg/dL (ref 0.3–1.2)
Total Protein: 6 g/dL — ABNORMAL LOW (ref 6.5–8.1)

## 2020-02-05 LAB — CBC WITH DIFFERENTIAL/PLATELET
Abs Immature Granulocytes: 1.14 10*3/uL — ABNORMAL HIGH (ref 0.00–0.07)
Basophils Absolute: 0.1 10*3/uL (ref 0.0–0.1)
Basophils Relative: 0 %
Eosinophils Absolute: 0.1 10*3/uL (ref 0.0–0.5)
Eosinophils Relative: 1 %
HCT: 43.4 % (ref 39.0–52.0)
Hemoglobin: 14.6 g/dL (ref 13.0–17.0)
Immature Granulocytes: 6 %
Lymphocytes Relative: 7 %
Lymphs Abs: 1.5 10*3/uL (ref 0.7–4.0)
MCH: 28 pg (ref 26.0–34.0)
MCHC: 33.6 g/dL (ref 30.0–36.0)
MCV: 83.1 fL (ref 80.0–100.0)
Monocytes Absolute: 1.5 10*3/uL — ABNORMAL HIGH (ref 0.1–1.0)
Monocytes Relative: 7 %
Neutro Abs: 16.6 10*3/uL — ABNORMAL HIGH (ref 1.7–7.7)
Neutrophils Relative %: 79 %
Platelets: 274 10*3/uL (ref 150–400)
RBC: 5.22 MIL/uL (ref 4.22–5.81)
RDW: 13.2 % (ref 11.5–15.5)
WBC: 20.9 10*3/uL — ABNORMAL HIGH (ref 4.0–10.5)
nRBC: 0 % (ref 0.0–0.2)

## 2020-02-05 LAB — LIPID PANEL
Cholesterol: 220 mg/dL — ABNORMAL HIGH (ref 0–200)
HDL: 40 mg/dL — ABNORMAL LOW (ref >40)
LDL Cholesterol: 124 mg/dL — ABNORMAL HIGH (ref 0–99)
Total CHOL/HDL Ratio: 5.5 RATIO
Triglycerides: 281 mg/dL — ABNORMAL HIGH (ref <150)
VLDL: 56 mg/dL — ABNORMAL HIGH (ref 0–40)

## 2020-02-05 LAB — MAGNESIUM
Magnesium: 3.2 mg/dL — ABNORMAL HIGH (ref 1.7–2.4)
Magnesium: 2.6 mg/dL — ABNORMAL HIGH (ref 1.7–2.4)

## 2020-02-05 LAB — GLUCOSE, CAPILLARY
Glucose-Capillary: 150 mg/dL — ABNORMAL HIGH (ref 70–99)
Glucose-Capillary: 155 mg/dL — ABNORMAL HIGH (ref 70–99)
Glucose-Capillary: 174 mg/dL — ABNORMAL HIGH (ref 70–99)
Glucose-Capillary: 180 mg/dL — ABNORMAL HIGH (ref 70–99)
Glucose-Capillary: 185 mg/dL — ABNORMAL HIGH (ref 70–99)
Glucose-Capillary: 187 mg/dL — ABNORMAL HIGH (ref 70–99)
Glucose-Capillary: 192 mg/dL — ABNORMAL HIGH (ref 70–99)
Glucose-Capillary: 198 mg/dL — ABNORMAL HIGH (ref 70–99)
Glucose-Capillary: 233 mg/dL — ABNORMAL HIGH (ref 70–99)
Glucose-Capillary: 236 mg/dL — ABNORMAL HIGH (ref 70–99)
Glucose-Capillary: 245 mg/dL — ABNORMAL HIGH (ref 70–99)
Glucose-Capillary: 246 mg/dL — ABNORMAL HIGH (ref 70–99)
Glucose-Capillary: 265 mg/dL — ABNORMAL HIGH (ref 70–99)
Glucose-Capillary: 302 mg/dL — ABNORMAL HIGH (ref 70–99)
Glucose-Capillary: 384 mg/dL — ABNORMAL HIGH (ref 70–99)

## 2020-02-05 LAB — BETA-HYDROXYBUTYRIC ACID: Beta-Hydroxybutyric Acid: 1.87 mmol/L — ABNORMAL HIGH (ref 0.05–0.27)

## 2020-02-05 LAB — PHOSPHORUS
Phosphorus: <1 mg/dL — CL (ref 2.5–4.6)
Phosphorus: <1 mg/dL — CL (ref 2.5–4.6)
Phosphorus: 1.2 mg/dL — ABNORMAL LOW (ref 2.5–4.6)

## 2020-02-05 LAB — PROCALCITONIN: Procalcitonin: 0.69 ng/mL

## 2020-02-05 LAB — ALBUMIN: Albumin: 2.5 g/dL — ABNORMAL LOW (ref 3.5–5.0)

## 2020-02-05 MED ORDER — INSULIN ASPART 100 UNIT/ML ~~LOC~~ SOLN: 0.0000 [IU] | Freq: Every day | SUBCUTANEOUS | Status: DC

## 2020-02-05 MED ORDER — INSULIN GLARGINE 100 UNIT/ML ~~LOC~~ SOLN
10.0000 [IU] | Freq: Every day | SUBCUTANEOUS | Status: DC
  Administered 2020-02-05 – 2020-02-06 (×2): 10 [IU] via SUBCUTANEOUS
  Filled 2020-02-05 (×2): qty 0.1

## 2020-02-05 MED ORDER — INSULIN ASPART 100 UNIT/ML ~~LOC~~ SOLN: 0.0000 [IU] | Freq: Three times a day (TID) | SUBCUTANEOUS | Status: DC

## 2020-02-05 MED ORDER — SODIUM CHLORIDE 0.9 % IV SOLN
100.0000 mg | Freq: Every day | INTRAVENOUS | Status: AC
  Administered 2020-02-06 – 2020-02-09 (×4): 100 mg via INTRAVENOUS
  Filled 2020-02-05 (×4): qty 20

## 2020-02-05 MED ORDER — POTASSIUM PHOSPHATES 15 MMOLE/5ML IV SOLN
30.0000 mmol | Freq: Once | INTRAVENOUS | Status: AC
  Administered 2020-02-05: 30 mmol via INTRAVENOUS
  Filled 2020-02-05: qty 10

## 2020-02-05 MED ORDER — INSULIN ASPART 100 UNIT/ML ~~LOC~~ SOLN: 3.0000 [IU] | Freq: Three times a day (TID) | SUBCUTANEOUS | Status: DC

## 2020-02-05 MED ORDER — INSULIN ASPART 100 UNIT/ML ~~LOC~~ SOLN
0.0000 [IU] | SUBCUTANEOUS | Status: DC
  Administered 2020-02-05: 2 [IU] via SUBCUTANEOUS
  Administered 2020-02-05: 9 [IU] via SUBCUTANEOUS
  Administered 2020-02-05: 7 [IU] via SUBCUTANEOUS
  Administered 2020-02-06: 5 [IU] via SUBCUTANEOUS
  Administered 2020-02-06: 3 [IU] via SUBCUTANEOUS
  Administered 2020-02-06: 7 [IU] via SUBCUTANEOUS
  Administered 2020-02-06: 2 [IU] via SUBCUTANEOUS
  Administered 2020-02-06: 7 [IU] via SUBCUTANEOUS
  Administered 2020-02-06: 2 [IU] via SUBCUTANEOUS
  Administered 2020-02-07 (×2): 3 [IU] via SUBCUTANEOUS
  Administered 2020-02-07 (×2): 1 [IU] via SUBCUTANEOUS
  Administered 2020-02-07 – 2020-02-08 (×2): 3 [IU] via SUBCUTANEOUS
  Administered 2020-02-08 (×2): 1 [IU] via SUBCUTANEOUS
  Administered 2020-02-08: 3 [IU] via SUBCUTANEOUS
  Administered 2020-02-08 (×2): 2 [IU] via SUBCUTANEOUS
  Administered 2020-02-09 (×3): 1 [IU] via SUBCUTANEOUS
  Administered 2020-02-09 – 2020-02-10 (×2): 2 [IU] via SUBCUTANEOUS
  Administered 2020-02-10 (×4): 1 [IU] via SUBCUTANEOUS
  Administered 2020-02-10: 2 [IU] via SUBCUTANEOUS
  Administered 2020-02-11 (×2): 1 [IU] via SUBCUTANEOUS
  Administered 2020-02-11: 2 [IU] via SUBCUTANEOUS
  Administered 2020-02-11 – 2020-02-12 (×2): 1 [IU] via SUBCUTANEOUS
  Administered 2020-02-14: 3 [IU] via SUBCUTANEOUS
  Administered 2020-02-14 – 2020-02-15 (×8): 2 [IU] via SUBCUTANEOUS
  Administered 2020-02-15: 3 [IU] via SUBCUTANEOUS
  Administered 2020-02-15: 5 [IU] via SUBCUTANEOUS
  Administered 2020-02-16 (×2): 2 [IU] via SUBCUTANEOUS
  Administered 2020-02-16: 1 [IU] via SUBCUTANEOUS
  Administered 2020-02-16: 3 [IU] via SUBCUTANEOUS
  Administered 2020-02-16: 2 [IU] via SUBCUTANEOUS
  Administered 2020-02-16: 3 [IU] via SUBCUTANEOUS
  Administered 2020-02-16: 5 [IU] via SUBCUTANEOUS
  Administered 2020-02-17: 2 [IU] via SUBCUTANEOUS
  Administered 2020-02-17: 3 [IU] via SUBCUTANEOUS
  Administered 2020-02-17: 2 [IU] via SUBCUTANEOUS
  Administered 2020-02-17: 1 [IU] via SUBCUTANEOUS
  Administered 2020-02-17: 2 [IU] via SUBCUTANEOUS
  Administered 2020-02-18: 3 [IU] via SUBCUTANEOUS
  Administered 2020-02-18 (×2): 2 [IU] via SUBCUTANEOUS
  Administered 2020-02-18 (×2): 3 [IU] via SUBCUTANEOUS
  Administered 2020-02-18 – 2020-02-19 (×2): 2 [IU] via SUBCUTANEOUS
  Administered 2020-02-19: 1 [IU] via SUBCUTANEOUS
  Administered 2020-02-19: 2 [IU] via SUBCUTANEOUS
  Administered 2020-02-19: 1 [IU] via SUBCUTANEOUS

## 2020-02-05 MED ORDER — POTASSIUM CHLORIDE 10 MEQ/100ML IV SOLN
10.0000 meq | INTRAVENOUS | Status: AC
  Administered 2020-02-05 (×4): 10 meq via INTRAVENOUS
  Filled 2020-02-05 (×4): qty 100

## 2020-02-05 MED ORDER — CHLORHEXIDINE GLUCONATE CLOTH 2 % EX PADS
6.0000 | MEDICATED_PAD | Freq: Every day | CUTANEOUS | Status: DC
  Administered 2020-02-05 – 2020-02-22 (×18): 6 via TOPICAL

## 2020-02-05 MED ORDER — SODIUM CHLORIDE 0.9 % IV SOLN
100.0000 mg | INTRAVENOUS | Status: AC
  Administered 2020-02-05 (×2): 100 mg via INTRAVENOUS
  Filled 2020-02-05 (×2): qty 20

## 2020-02-05 NOTE — Progress Notes (Addendum)
CRITICAL VALUE ALERT  Critical Value:  Phosphorus  Less < 1.0 Date & Time Notied:  02/05/20, 1443  Provider Notified: MD. Constance Goltz  Orders Received/Actions taken:  MD. are aware.

## 2020-02-05 NOTE — Progress Notes (Signed)
CRITICAL VALUE ALERT  Critical Value:  Phosphorus- <1.0 Date & Time Notied:  02/05/20 @1313   Provider Notified: Dr. Orders Received/Actions taken: Orders awaiting

## 2020-02-05 NOTE — Plan of Care (Signed)

## 2020-02-05 NOTE — Progress Notes (Signed)
Inpatient Diabetes Program Recommendations  AACE/ADA: New Consensus Statement on Inpatient Glycemic Control (2015)  Target Ranges:  Prepandial:   less than 140 mg/dL      Peak postprandial:   less than 180 mg/dL (1-2 hours)      Critically ill patients:  140 - 180 mg/dL   Lab Results  Component Value Date   GLUCAP 155 (H) 02/05/2020   HGBA1C 13.2 (H) 02/04/2020    Review of Glycemic Control  Diabetes history: DM2 Outpatient Diabetes medications: metformin 1000 mg bid Current orders for Inpatient glycemic control: Lantus 10 units QD, Novolog 0-9 units Q4H.   Lantus given 2 H prior to discontinuation of drip. HgbA1C - 13.2% - very poor control Will need to be discharged on insulin Has been in transitional house for ETOH rehab for past 2 months. Most recently staying in hotel d/t Covid 19 diagnosis. Remains on D5 @ 125/H and NPO d/t poor mental status and concern for aspiration. Not appropriate to speak with about his diabetes control.  No insurance and will need PCP to manage his diabetes.  Inpatient Diabetes Program Recommendations:     Agree with orders.  Will need meal coverage insulin when pt starts eating. Titrate Lantus if FBS > 180 mg/dL.  Will follow closely.  Thank you. Ailene Ards, RD, LDN, CDE Inpatient Diabetes Coordinator [phone]

## 2020-02-05 NOTE — Evaluation (Signed)
Occupational Therapy Evaluation Patient Details Name: Steven Harvey MRN: [id number] DOB: 02-22-1966 Today's Date: 02/05/2020    History of Present Illness Pt is a 54 y.o. male with recent COVID-19 dx, admitted 02/04/20 fatigue, hypothermia and gastritis. Found to have DKA. CXR with likely PNA. PMH includes DM2, HTN, HLD.   Clinical Impression   This 54 y/o male presents with the above. PTA pt independent with ADL and functional mobility, per chart pt typically residing in halfway house but has recently been staying in motel due to Covid+ status. Pt with notable weakness, fatigue and overall feeling unwell. Pt tolerated sitting EOB for short period of time at supervision level however with episode of emesis while sitting upright, therefore deferred further mobility attempts. Pt not very forthcoming with responses to questions and requires encouragement to participate. He will benefit from continued acute OT services and given current deficits recommend SNF level therapies at time of discharge to progress pt towards his PLOF. Will follow.     Follow Up Recommendations  SNF;Supervision/Assistance - 24 hour    Equipment Recommendations  Other (comment)(TBD)           Precautions / Restrictions Precautions Precautions: Fall;Other (comment) Precaution Comments: nausea/vomiting, bear hugger due to hypothermia Restrictions Weight Bearing Restrictions: No      Mobility Bed Mobility Overal bed mobility: Needs Assistance Bed Mobility: Supine to Sit;Sit to Supine     Supine to sit: Mod assist Sit to supine: Min assist   General bed mobility comments: Pt repositioning in bed independently; modA to assist scooting hips to EOB and elevating trunk, unsure if pt actually needed this much assist or just reluctant to initiate movement due to not feeling well  Transfers                 General transfer comment: Deferred secondary to nausea/vomitting while sitting EOB; pt declining further  mobility    Balance Overall balance assessment: Needs assistance   Sitting balance-Leahy Scale: Fair                                     ADL either performed or assessed with clinical judgement   ADL Overall ADL's : Needs assistance/impaired Eating/Feeding: NPO   Grooming: Min guard;Minimal assistance;Sitting;Wash/dry hands   Upper Body Bathing: Minimal assistance;Sitting   Lower Body Bathing: Maximal assistance;Total assistance;Sitting/lateral leans;Bed level   Upper Body Dressing : Sitting;Moderate assistance Upper Body Dressing Details (indicate cue type and reason): changing new gown Lower Body Dressing: Total assistance;Maximal assistance;Sitting/lateral leans;Bed level                 General ADL Comments: pt tolerating sitting EOB during this session but with episode of emesis while sitting upright. Pt with notable weakness, fatigue, ?impaired cognition                         Pertinent Vitals/Pain Pain Assessment: Faces Faces Pain Scale: Hurts even more Pain Location: Generalized Pain Descriptors / Indicators: Discomfort;Grimacing;Tiring Pain Intervention(s): Limited activity within patient's tolerance;Monitored during session;Repositioned     Hand Dominance     Extremity/Trunk Assessment Upper Extremity Assessment Upper Extremity Assessment: Generalized weakness   Lower Extremity Assessment Lower Extremity Assessment: Defer to PT evaluation;Generalized weakness   Cervical / Trunk Assessment Cervical / Trunk Assessment: Kyphotic   Communication Communication Communication: No difficulties   Cognition Arousal/Alertness: Awake/alert Behavior During Therapy: Flat affect Overall  Cognitive Status: Difficult to assess                                 General Comments: Difficult to assess if true cognitive impairment vs fatigue vs not feeling well. pt seemed frustrated by being asked questions, repeating, "Just let me  relax." Not consistently following commands   General Comments  SpO2 98% on RA, BP 161/94, HR 102; pt declined bear hugger at end of session    Exercises     Shoulder Instructions      Home Living Family/patient expects to be discharged to:: Other (Comment)                                 Additional Comments: Per chart: Has been in transitional house for ETOH rehab for past 2 months; most recently staying in hotel d/t Covid 19 diagnosis.      Prior Functioning/Environment Level of Independence: Independent        Comments: Pt reports independent without DME - unsure if reliable historian. Not forthcoming with answers to questions and did not appreciate being asked questions        OT Problem List: Decreased strength;Decreased range of motion;Decreased activity tolerance;Impaired balance (sitting and/or standing);Decreased cognition;Decreased safety awareness;Cardiopulmonary status limiting activity;Pain      OT Treatment/Interventions: Self-care/ADL training;Therapeutic exercise;Energy conservation;DME and/or AE instruction;Therapeutic activities;Patient/family education;Balance training    OT Goals(Current goals can be found in the care plan section) Acute Rehab OT Goals Patient Stated Goal: "Just let me relax" OT Goal Formulation: With patient Time For Goal Achievement: 02/19/20 Potential to Achieve Goals: Good  OT Frequency: Min 2X/week   Barriers to D/C:            Co-evaluation PT/OT/SLP Co-Evaluation/Treatment: Yes Reason for Co-Treatment: For patient/therapist safety;Complexity of the patient's impairments (multi-system involvement);Necessary to address cognition/behavior during functional activity PT goals addressed during session: Mobility/safety with mobility OT goals addressed during session: ADL's and self-care      AM-PAC OT "6 Clicks" Daily Activity     Outcome Measure Help from another person eating meals?: Total(NPO) Help from another  person taking care of personal grooming?: A Lot Help from another person toileting, which includes using toliet, bedpan, or urinal?: Total Help from another person bathing (including washing, rinsing, drying)?: A Lot Help from another person to put on and taking off regular upper body clothing?: A Lot Help from another person to put on and taking off regular lower body clothing?: Total 6 Click Score: 9   End of Session Nurse Communication: Mobility status  Activity Tolerance: Patient limited by fatigue;Other (comment)(limited due to n/v) Patient left: in bed;with call bell/phone within reach;with bed alarm set  OT Visit Diagnosis: Muscle weakness (generalized) (M62.81);Other abnormalities of gait and mobility (R26.89)                Time: 8315-1761 OT Time Calculation (min): 23 min Charges:  OT General Charges $OT Visit: 1 Visit OT Evaluation $OT Eval Moderate Complexity: 1 Mod  Marcy Siren, OT [employer] Pager [phone] Office [phone]  Orlando Penner 02/05/2020, 5:31 PM

## 2020-02-05 NOTE — Evaluation (Signed)
Physical Therapy Evaluation Patient Details Name: Steven Harvey MRN: [id number] DOB: 05/23/1966 Today's Date: 02/05/2020   History of Present Illness  Pt is a 54 y.o. male with recent COVID-19 dx, admitted 02/04/20 fatigue, hypothermia and gastritis. Found to have DKA. CXR with likely PNA. PMH includes DM2, HTN, HLD.    Clinical Impression  Pt presents with an overall decrease in functional mobility secondary to above. PTA, pt reports independent with mobility; pt not forthcoming with details regarding PLOF; per chart, was at a transitional house, but has been staying at hotel for COVID isolation  Today, pt requiring up to [facility] for limited mobility; limited by nausea/vomiting, generalized weakness and fatigue. Pt would benefit from continued acute PT services to maximize functional mobility and independence prior to d/c with SNF-level therapies.     Follow Up Recommendations SNF;Supervision for mobility/OOB    Equipment Recommendations  (TBD)    Recommendations for Other Services       Precautions / Restrictions Precautions Precautions: Fall;Other (comment) Precaution Comments: nausea/vomiting, bear hugger due to hypothermia Restrictions Weight Bearing Restrictions: No      Mobility  Bed Mobility Overal bed mobility: Needs Assistance Bed Mobility: Supine to Sit;Sit to Supine     Supine to sit: Mod assist Sit to supine: Min assist   General bed mobility comments: Pt repositioning in bed independently; modA to assist scooting hips to EOB and elevating trunk, unsure if pt actually needed this much assist or just reluctant to initiate movement due to not feeling well  Transfers                 General transfer comment: Deferred secondary to nausea/vomitting while sitting EOB; pt declining further mobility  Ambulation/Gait                Stairs            Wheelchair Mobility    Modified Rankin (Stroke Patients Only)       Balance Overall balance  assessment: Needs assistance   Sitting balance-Leahy Scale: Fair                                       Pertinent Vitals/Pain Pain Assessment: Faces Faces Pain Scale: Hurts even more Pain Location: Generalized Pain Descriptors / Indicators: Discomfort;Grimacing;Tiring Pain Intervention(s): Monitored during session;Limited activity within patient's tolerance    Home Living Family/patient expects to be discharged to:: Other (Comment)                 Additional Comments: Per chart: Has been in transitional house for ETOH rehab for past 2 months; most recently staying in hotel d/t Covid 19 diagnosis.    Prior Function Level of Independence: Independent         Comments: Pt reports independent without DME - unsure if reliable historian. Not forthcoming with answers to questions and did not appreciate being asked questions     Hand Dominance        Extremity/Trunk Assessment   Upper Extremity Assessment Upper Extremity Assessment: Generalized weakness    Lower Extremity Assessment Lower Extremity Assessment: Generalized weakness    Cervical / Trunk Assessment Cervical / Trunk Assessment: Kyphotic  Communication   Communication: No difficulties  Cognition Arousal/Alertness: Awake/alert Behavior During Therapy: Flat affect Overall Cognitive Status: Difficult to assess  General Comments: Difficult to assess if true cognitive impairment vs fatigue vs not feeling well. pt seemed frustrated by being asked questions, repeating, "Just let me relax." Not consistently following commands      General Comments General comments (skin integrity, edema, etc.): SpO2 98% on RA, BP 161/94, HR 102; pt declined bear hugger at end of session    Exercises     Assessment/Plan    PT Assessment Patient needs continued PT services  PT Problem List Decreased strength;Decreased activity tolerance;Decreased  balance;Decreased mobility;Decreased cognition;Decreased knowledge of use of DME       PT Treatment Interventions DME instruction;Gait training;Stair training;Functional mobility training;Therapeutic activities;Therapeutic exercise;Balance training;Patient/family education    PT Goals (Current goals can be found in the Care Plan section)  Acute Rehab PT Goals Patient Stated Goal: "Just let me relax" PT Goal Formulation: With patient Time For Goal Achievement: 02/19/20 Potential to Achieve Goals: Fair    Frequency Min 3X/week   Barriers to discharge Decreased caregiver support      Co-evaluation PT/OT/SLP Co-Evaluation/Treatment: Yes Reason for Co-Treatment: Complexity of the patient's impairments (multi-system involvement);Necessary to address cognition/behavior during functional activity;For patient/therapist safety;To address functional/ADL transfers PT goals addressed during session: Mobility/safety with mobility         AM-PAC PT "6 Clicks" Mobility  Outcome Measure Help needed turning from your back to your side while in a flat bed without using bedrails?: None Help needed moving from lying on your back to sitting on the side of a flat bed without using bedrails?: A Lot Help needed moving to and from a bed to a chair (including a wheelchair)?: A Lot Help needed standing up from a chair using your arms (e.g., wheelchair or bedside chair)?: A Lot Help needed to walk in hospital room?: A Lot Help needed climbing 3-5 steps with a railing? : A Lot 6 Click Score: 14    End of Session   Activity Tolerance: Patient tolerated treatment well Patient left: in bed;with call bell/phone within reach;with bed alarm set Nurse Communication: Mobility status PT Visit Diagnosis: Other abnormalities of gait and mobility (R26.89);Muscle weakness (generalized) (M62.81)    Time: 2025-4270 PT Time Calculation (min) (ACUTE ONLY): 23 min   Charges:   PT Evaluation $PT Eval Moderate  Complexity: [place], PT, DPT Acute Rehabilitation Services  Pager [phone] Office [phone]  Derry Lory 02/05/2020, 4:28 PM

## 2020-02-05 NOTE — Progress Notes (Signed)
Family Medicine Teaching Service Daily Progress Note Intern Pager: [phone]  Patient name: Steven Harvey Medical record number: [id number] Date of birth: [birth year] Age: 54 y.o. Gender: male  Primary Care Provider: Patient, No Pcp Per Consultants: None Code Status: Full  Pt Overview and Major Events to Date:  2/27-admitted 2/28-transitioned off Endo tool  Assessment and Plan: [name] is a 54 y.o. male presenting with decreased PO intake, emesis, and hypothermia. PMH is significant for recent COVID infection, T2DM, HTN, HLD, Alcoholism, and homelessness.   DKA  T2DM Found down in a hotel room (self isolating for Covid) after several episodes of vomiting and decreased PO intake. Recently diagnosed with covid, which likely triggered his DKA.UDS negative. UA negative for infection.   Endorsed compliance with metformin but A1c is 13.2 so either noncompliant or undertreated.  Gap closed today x3.  Transitioned off Endo tool, but remained on dextrose containing fluids and n.p.o. due to poor mental status and concern for aspiration. -Every 4 hour NovoLog while n.p.o. -Give Lantus to transition off Endo tool -f/u repeat EKG -BMP q4h/ BHB q8h -f/u blood cx -recheck temp; vital signs per protocol -continuous cardiac monitoring and pulse ox  hematemasis -occurred overnight on 2/27.  Patient has abdominal tenderness.  Uncertain if this is related to mucosal tear or gastric ulcers.  In the setting of recent history of excessive vomiting, more likely a tear. - lovenox stopped -40 mg twice daily Protonix started -Continue n.p.o. for now.  If able to pass swallow study, start with liquid diet.  Cachexia  malnutrition  BMI 19.73. albumin 3.2.    Prolonged QTc 513.  Avoid QTc prolonging medications.    COVID  SIRS  Presented w/ 4/4 SIRS criteria. Recently test positive for COVID 01/28/2020. No indications of bacterial infection.  He has experienced 8 days of emesis and decreased p.o.  intake stating his last meal was 7 days ago. CXR w/ patchy opacities within both lungs, indeterminate but suspicious for infection. LA 4.4> 2.3.   This is likely the result of combination of Covid, DKA, and dehydration, but cannot fully rule out bacterial infection.  -Isolation precaution (Airborne and contact)  - IV vancomycin and cefepime per pharmacy; narrow when culture/sensivities available.  Consider stopping given nonbacterial alternative explanations  - continue D5 1/2NS 167mL/h -f/u Blood & urine cultures - monitor fever -cardiac monitoring, pulse ox  AKI Acute, stable. Patient with serum creatnine of 2.59 on admission with baseline of 1.08 from previous admissions. Secondary to dehydration.   - Cont IVF: D51/2 NS 138ml/hr -Daily BMP - avoiding nephrotoxic medications  HTN Hypertensive to the 150s currently. Cherlynn Perches medication compliance with Lisinopril 5mg  daily. -Hold home meds while NPO  HLD No lipid panel on file.  -f/u AM Lipid panel  Alcoholism Endorses last alcoholic beverage was 4 months ago.  AST/ALT 15/12. CIWA not documented overnight.  -CIWA w/o ativan  Homelessness Currently living out of a motel due to isolating for Covid.  Was previously in a group home. -TOC consult   FEN/GI: Zofran (hold for repeat EKG) Prophylaxis: Lovenox  Disposition: Home pending improvement  Subjective:  Patient endorses that he is hungry and would like something to eat.  Also states that his abdomen is painful.  Denies chest pain, headache.  Objective: Temp:  [93.9 F (34.4 C)-99.2 F (37.3 C)] 99.2 F (37.3 C) (02/28 0000) Pulse Rate:  [109-151] 109 (02/28 0743) Resp:  [14-40] 14 (02/28 0743) BP: (102-158)/(76-96) 158/92 (02/28 0743) SpO2:  [94 %-100 %]  97 % (02/28 0743) Weight:  [57.2 kg] 57.2 kg (02/27 1203) Physical Exam: General: Alert, oriented.  Moderate distress.  Patient had regurgitation during exam.  Suctioned out by nurse.  Appeared to be nonbloody  saliva Cardiovascular: Regular rhythm, tachycardic rate.  No murmurs. Respiratory: Crackles heard on exam.  Left side appears more clear than right. Abdomen: Scaphoid abdomen.  Tender to palpation diffusely, but predominantly suprapubic. GU: Foley in place.  Urine appears nonbloody Extremities: Cachectic appearing extremities.  No edema.  Laboratory: Recent Labs  Lab 02/04/20 1234 02/04/20 1257 02/04/20 2222  WBC 23.8*  --  20.9*  HGB 15.8 17.0  16.7 14.6  HCT 51.3 50.0  49.0 43.4  PLT 330  --  274   Recent Labs  Lab 02/04/20 1234 02/04/20 1257 02/04/20 2222 02/05/20 0235 02/05/20 0706  NA 145   < > 151* 152* 152*  K 4.2   < > 3.4* 3.4* 4.1  CL 105   < > 121* 123* 124*  CO2 <7*   < > 14* 17* 18*  BUN 40*   < > 35* 32* 29*  CREATININE 2.59*   < > 1.93* 1.65* 1.53*  CALCIUM 9.1   < > 9.1 9.0 8.7*  PROT 7.8  --   --   --   --   BILITOT 1.9*  --   --   --   --   ALKPHOS 88  --   --   --   --   ALT 12  --   --   --   --   AST 15  --   --   --   --   GLUCOSE 725*   < > 367* 272* 217*   < > = values in this interval not displayed.     Imaging/Diagnostic Tests:   [name], MD 02/05/2020, 8:42 AM PGY-2, [place] Intern pager: [phone], text pages welcome

## 2020-02-05 NOTE — Progress Notes (Signed)
Pharmacy Consult - Remdesivir  60 yom presenting COVID-19 positive with respiratory symptoms requiring hospitalization - tested positive on 01/28/2020. Pharmacy consulted to dose Remdesivir. LFTs WNL.   Plan: Remdesivir 200mg  IV x 1; then 100mg  IV q24h to complete 5 total doses Monitor clinical progress, ALT  , PharmD, BCCCP Clinical Pharmacist  Phone: [phone]  Please check AMION for all [facility] Pharmacy phone numbers After 10:00 PM, call Main Pharmacy [phone] 02/05/2020 7:27 PM

## 2020-02-05 NOTE — Progress Notes (Signed)
-   Pt. requesting to drink, allowed small sips of water without aspiration noted; Dr. Constance Goltz aware.  - Pt. is hypothermic-bair hugger applied

## 2020-02-05 NOTE — Progress Notes (Signed)
Spoke with pt.'s brother Tadhg Eskew and updated him about the pt.'s status, all questions answered.

## 2020-02-06 ENCOUNTER — Inpatient Hospital Stay (HOSPITAL_COMMUNITY): Payer: Self-pay

## 2020-02-06 LAB — CBC WITH DIFFERENTIAL/PLATELET
Abs Immature Granulocytes: 0.93 10*3/uL — ABNORMAL HIGH (ref 0.00–0.07)
Basophils Absolute: 0.1 10*3/uL (ref 0.0–0.1)
Basophils Relative: 1 %
Eosinophils Absolute: 0 10*3/uL (ref 0.0–0.5)
Eosinophils Relative: 0 %
HCT: 34.5 % — ABNORMAL LOW (ref 39.0–52.0)
Hemoglobin: 11.8 g/dL — ABNORMAL LOW (ref 13.0–17.0)
Immature Granulocytes: 5 %
Lymphocytes Relative: 7 %
Lymphs Abs: 1.3 10*3/uL (ref 0.7–4.0)
MCH: 28.2 pg (ref 26.0–34.0)
MCHC: 34.2 g/dL (ref 30.0–36.0)
MCV: 82.3 fL (ref 80.0–100.0)
Monocytes Absolute: 1.5 10*3/uL — ABNORMAL HIGH (ref 0.1–1.0)
Monocytes Relative: 9 %
Neutro Abs: 13.3 10*3/uL — ABNORMAL HIGH (ref 1.7–7.7)
Neutrophils Relative %: 78 %
Platelets: 209 10*3/uL (ref 150–400)
RBC: 4.19 MIL/uL — ABNORMAL LOW (ref 4.22–5.81)
RDW: 13.3 % (ref 11.5–15.5)
WBC: 17.2 10*3/uL — ABNORMAL HIGH (ref 4.0–10.5)
nRBC: 0 % (ref 0.0–0.2)

## 2020-02-06 LAB — GLUCOSE, CAPILLARY
Glucose-Capillary: 170 mg/dL — ABNORMAL HIGH (ref 70–99)
Glucose-Capillary: 171 mg/dL — ABNORMAL HIGH (ref 70–99)
Glucose-Capillary: 208 mg/dL — ABNORMAL HIGH (ref 70–99)
Glucose-Capillary: 252 mg/dL — ABNORMAL HIGH (ref 70–99)
Glucose-Capillary: 323 mg/dL — ABNORMAL HIGH (ref 70–99)
Glucose-Capillary: 339 mg/dL — ABNORMAL HIGH (ref 70–99)

## 2020-02-06 LAB — URINE CULTURE: Culture: NO GROWTH

## 2020-02-06 LAB — BASIC METABOLIC PANEL
Anion gap: 11 (ref 5–15)
BUN: 11 mg/dL (ref 6–20)
CO2: 17 mmol/L — ABNORMAL LOW (ref 22–32)
Calcium: 8.1 mg/dL — ABNORMAL LOW (ref 8.9–10.3)
Chloride: 123 mmol/L — ABNORMAL HIGH (ref 98–111)
Creatinine, Ser: 1.06 mg/dL (ref 0.61–1.24)
GFR calc Af Amer: >60 mL/min (ref >60)
GFR calc non Af Amer: >60 mL/min (ref >60)
Glucose, Bld: 268 mg/dL — ABNORMAL HIGH (ref 70–99)
Potassium: 3 mmol/L — ABNORMAL LOW (ref 3.5–5.1)
Sodium: 151 mmol/L — ABNORMAL HIGH (ref 135–145)

## 2020-02-06 LAB — PROCALCITONIN: Procalcitonin: 0.28 ng/mL

## 2020-02-06 LAB — PHOSPHORUS: Phosphorus: 1.9 mg/dL — ABNORMAL LOW (ref 2.5–4.6)

## 2020-02-06 LAB — LIPASE, BLOOD: Lipase: 32 U/L (ref 11–51)

## 2020-02-06 MED ORDER — INSULIN GLARGINE 100 UNIT/ML ~~LOC~~ SOLN
15.0000 [IU] | Freq: Every day | SUBCUTANEOUS | Status: DC
  Administered 2020-02-07 – 2020-02-12 (×6): 15 [IU] via SUBCUTANEOUS
  Filled 2020-02-06 (×7): qty 0.15

## 2020-02-06 MED ORDER — TRAZODONE HCL 50 MG PO TABS
50.0000 mg | ORAL_TABLET | Freq: Every day | ORAL | Status: DC
  Administered 2020-02-07 – 2020-02-08 (×3): 50 mg via ORAL
  Filled 2020-02-06 (×3): qty 1

## 2020-02-06 MED ORDER — POTASSIUM CHLORIDE 10 MEQ/100ML IV SOLN
10.0000 meq | INTRAVENOUS | Status: AC
  Administered 2020-02-06 (×3): 10 meq via INTRAVENOUS
  Filled 2020-02-06 (×3): qty 100

## 2020-02-06 MED ORDER — PROCHLORPERAZINE EDISYLATE 10 MG/2ML IJ SOLN
10.0000 mg | Freq: Four times a day (QID) | INTRAMUSCULAR | Status: AC | PRN
  Administered 2020-02-07 – 2020-02-08 (×3): 10 mg via INTRAVENOUS
  Filled 2020-02-06 (×4): qty 2

## 2020-02-06 MED ORDER — POTASSIUM PHOSPHATES 15 MMOLE/5ML IV SOLN
20.0000 mmol | Freq: Once | INTRAVENOUS | Status: AC
  Administered 2020-02-06 – 2020-02-07 (×2): 20 mmol via INTRAVENOUS
  Filled 2020-02-06: qty 6.67

## 2020-02-06 MED ORDER — IOHEXOL 300 MG/ML  SOLN
100.0000 mL | Freq: Once | INTRAMUSCULAR | Status: AC | PRN
  Administered 2020-02-06: 100 mL via INTRAVENOUS

## 2020-02-06 NOTE — Plan of Care (Signed)
  Problem: Clinical Measurements: Goal: Ability to maintain clinical measurements within normal limits will improve Outcome: Progressing Goal: Will remain free from infection Outcome: Progressing Goal: Diagnostic test results will improve Outcome: Progressing Goal: Respiratory complications will improve Outcome: Progressing Goal: Cardiovascular complication will be avoided Outcome: Progressing   Problem: Activity: Goal: Risk for activity intolerance will decrease Outcome: Progressing   Problem: Elimination: Goal: Will not experience complications related to bowel motility Outcome: Progressing   Problem: Safety: Goal: Ability to remain free from injury will improve Outcome: Progressing   Problem: Skin Integrity: Goal: Risk for impaired skin integrity will decrease Outcome: Progressing

## 2020-02-06 NOTE — Progress Notes (Signed)
CSW notes patient was recently placed at COVID hotel program for homeless patients (2/20). CSW will continue to follow for patient strengthening as SNF is currently recommended and patient needs to be more independent to return to hotel.  Steven Harvey Prom LCSW [phone]

## 2020-02-06 NOTE — Evaluation (Signed)
Clinical/Bedside Swallow Evaluation Patient Details  Name: Steven Harvey MRN: [id number] Date of Birth: June 25, 1966  Today's Date: 02/06/2020 Time: SLP Start Time (ACUTE ONLY): 1330 SLP Stop Time (ACUTE ONLY): 1340 SLP Time Calculation (min) (ACUTE ONLY): 10 min  Past Medical History:  Past Medical History:  Diagnosis Date  . Diabetes mellitus without complication [facility])    Past Surgical History: History reviewed. No pertinent surgical history. HPI:  Steven Harvey is a 54 y.o. male presenting with decreased PO intake, emesis, and hypothermia. PMH is significant for recent COVID infection, T2DM, HTN, HLD, Alcoholism, and homelessness. Found down in a hotel room (self isolating for Covid) after several episodes of vomiting and decreased PO intake. Recently diagnosed with covid, which likely triggered his DKA.UDS negative. UA negative for infection.   Endorsed compliance with metformin but A1c is 13.2 so either noncompliant or undertreated.  Gap closed today x3.  Transitioned off Endo tool, but remained on dextrose containing fluids and n.p.o. due to poor mental status and concern for aspiration.   Assessment / Plan / Recommendation Clinical Impression  Pt refused all solids due to severe nausea. Expect mastication and swallowing with solids WNL given no observed difficulties with thin liquids and pt denies any report of baseline dysphagia. Recommend starting a full liquid diet and advancing per pts preference to consume solids. No SLP f/u needed will sign off.  SLP Visit Diagnosis: Dysphagia, unspecified (R13.10)    Aspiration Risk  Mild aspiration risk;Risk for inadequate nutrition/hydration    Diet Recommendation Regular;Thin liquid   Liquid Administration via: Cup;Straw Medication Administration: Whole meds with liquid Supervision: Patient able to self feed    Other  Recommendations Oral Care Recommendations: Patient independent with oral care   Follow up Recommendations None       Frequency and Duration            Prognosis        Swallow Study   General HPI: Steven Harvey is a 54 y.o. male presenting with decreased PO intake, emesis, and hypothermia. PMH is significant for recent COVID infection, T2DM, HTN, HLD, Alcoholism, and homelessness. Found down in a hotel room (self isolating for Covid) after several episodes of vomiting and decreased PO intake. Recently diagnosed with covid, which likely triggered his DKA.UDS negative. UA negative for infection.   Endorsed compliance with metformin but A1c is 13.2 so either noncompliant or undertreated.  Gap closed today x3.  Transitioned off Endo tool, but remained on dextrose containing fluids and n.p.o. due to poor mental status and concern for aspiration. Type of Study: Bedside Swallow Evaluation Previous Swallow Assessment: none Diet Prior to this Study: NPO Temperature Spikes Noted: No Respiratory Status: Room air History of Recent Intubation: No Behavior/Cognition: Alert;Cooperative;Pleasant mood Oral Cavity Assessment: Within Functional Limits Oral Care Completed by SLP: No Oral Cavity - Dentition: Adequate natural dentition Vision: Functional for self-feeding Self-Feeding Abilities: Able to feed self Patient Positioning: Partially reclined Baseline Vocal Quality: Normal Volitional Cough: Strong Volitional Swallow: Able to elicit    Oral/Motor/Sensory Function Overall Oral Motor/Sensory Function: Within functional limits   Ice Chips     Thin Liquid Thin Liquid: Within functional limits Presentation: Straw;Self Fed    Nectar Thick Nectar Thick Liquid: Not tested   Honey Thick Honey Thick Liquid: Not tested   Puree Puree: Not tested   Solid     Solid: Not tested     Harlon Ditty, MA CCC-SLP  Acute Rehabilitation Services Pager [phone] Office [phone]  Dyanne Iha, Riley Nearing 02/06/2020,2:20  PM

## 2020-02-06 NOTE — Progress Notes (Signed)
Patient's hospital computer chart reviewed and his case discussed with the hospital team and he was on ibuprofen as an outpatient and he does have diabetes and did have a lipase elevation on admission so agree with continuing pump inhibitors and will begin work-up with repeat lipase and CT scan and if symptoms continue will probably need an endoscopy versus upper GI series and will electively need a screening colonoscopy because of his age as an outpatient and will check on tomorrow after we review above and decide how to proceed

## 2020-02-06 NOTE — Progress Notes (Signed)
Pt refused morning labs, and told phlebotomy to come back later.

## 2020-02-06 NOTE — Progress Notes (Signed)
Pt tolerated water well throughout the night. Did not have trouble swallowing. Pt still suctioning himself, but no aspiration noted. Spoke with [place], and orders were put in for SLP eval due to swallowing. Will continue to monitor.

## 2020-02-06 NOTE — Progress Notes (Signed)
Family Medicine Teaching Service Daily Progress Note Intern Pager: [phone]  Patient name: Steven Harvey Medical record number: [id number] Date of birth: 1966/05/14 Age: 54 y.o. Gender: male  Primary Care Provider: Patient, No Pcp Per Consultants: None Code Status: Full  Pt Overview and Major Events to Date:  2/27-admitted 2/28-transitioned off Endo tool  Assessment and Plan: Steven Harvey is a 54 y.o. male presenting with decreased PO intake, emesis, and hypothermia. PMH is significant for recent COVID infection, T2DM, HTN, HLD, Alcoholism, and homelessness.   DKA  T2DM Found down in a hotel room (self isolating for Covid) after several episodes of vomiting and decreased PO intake x1 week. Recently diagnosed with covid, which likely triggered his DKA. UDS negative. UA negative for infection.   Endorsed compliance with metformin, but A1c is 13.2 so either noncompliant or undertreated. Transitioned off Endo tool 2/28, but remained on dextrose containing fluids and n.p.o. due to poor mental status and concern for aspiration. SLP evaluation requested. CBG in last 24 hours ranged from 185-384, patient received 18U aspart and glargine 10U. Most recent CBG 323 and patient received 14U aspart this morning. Will increase lantus to 15U daily. Patient refused labs early this AM, able to obtain later in the morning. Hypernatremia to 151, d/c NS and continue only D5 1/2NS IVF. Hypokalemia to 3.0, repleting with potassium chloride x 1hr IV, as well as potassium phosphate (50 mmol), for a total of . Hypophosphatemia to 1.9, treated with kphos IV total of 50 mmol.  -Every 4 hour NovoLog while n.p.o. -Increase lantus to 15U daily -BMP q4h/ BHB q8h -D5 1/2NS IVF -recheck temp; vital signs per protocol -continuous cardiac monitoring and pulse ox -SLP evaluation pending, appreciate recommendations  COVID  SIRS  Presented w/ 4/4 SIRS criteria. Recently tested positive for COVID on 01/28/2020. No  indications of bacterial infection.  He has experienced 8 days of emesis and decreased p.o. intake stating his last meal was on 01/28/20. CXR w/ patchy opacities within both lungs, indeterminate but suspicious for infection. LA 4.4> 2.3.   This is likely the result of combination of Covid, DKA, and dehydration. Procalcitonin 0.28 and blood cultures and urine culture NG x2 days, bacterial process highly unlikely. Plan to dc IV abx today. -Isolation precaution (Airborne and contact)  -DC vancomycin and ctx -Remdesivir day 2/5 -Daily CMP -continue D5 1/2NS 132mL/h -f/u Blood & urine cultures -monitor fever -cardiac monitoring, pulse ox  hematemasis  Occurred overnight on 2/27.  Patient has not had any further emesis overnight last night, but still complaining of abdominal tenderness in the epigastric region.  Uncertain if this is related to mucosal tear or gastric ulcers.  In the setting of recent history of excessive vomiting, more likely a tear. No h/o of NSAID or ASA use, currently on protonix.  - lovenox stopped -40 mg twice daily Protonix started -Continue n.p.o. for now.  If able to pass swallow study, start with liquid diet.  Cachexia  malnutrition  BMI 19.73. albumin 3.2.   -Nutrition consulted, awaiting recommendations -Awaiting SLP evaluation before advancing diet from NPO  Prolonged QTc 513.  Avoid QTc prolonging medications.    AKI Acute, stable. Patient with serum creatinine of 2.59 on admission with baseline of 1.08 from previous admissions. Secondary to dehydration.  Pt refused labs this morning, will attempt to get labs later this AM. - Cont IVF: D51/2 NS 159ml/hr -Daily BMP - avoiding nephrotoxic medications  HTN Normotensive to hypertensive: SBP 134-166, DBP 72-116, most recently 134/72.  Endorses medication compliance with Lisinopril 5mg  daily. -Hold home meds while NPO  HLD Lipid panel total cholesterol 220, HDL 40, LDL 124, triglycerides 281. High intensity  statin recommended due to diabetes and ASCVD risk of 14% in the next 10 years. -Recommend starting rosuvastatin 20mg  daily when able to eat.  Alcoholism Endorses last alcoholic beverage was 4 months ago.  AST/ALT 15/12. CIWA not documented overnight.  -CIWA w/o ativan  Homelessness Currently living out of a motel due to isolating for Covid.  Was previously in a group home. -TOC consult   FEN/GI: NPO Prophylaxis: Lovenox  Disposition: Home pending improvement  Subjective:  Patient still having epigastric pain today, no emesis overnight.  Objective: Temp:  [95.6 F (35.3 C)-99.5 F (37.5 C)] 98.9 F (37.2 C) (03/01 0349) Pulse Rate:  [95-116] 98 (03/01 0349) Resp:  [14-35] 20 (03/01 0349) BP: (134-166)/(72-116) 134/72 (03/01 0349) SpO2:  [96 %-99 %] 97 % (03/01 0000) Physical Exam: General: Alert, oriented.  Moderate distress.  Patient had regurgitation during exam.  Suctioned out by nurse.  Appeared to be nonbloody saliva Cardiovascular: Regular rhythm, tachycardic rate.  No murmurs. Respiratory: Crackles heard on exam.  Left side appears more clear than right. Abdomen: Scaphoid abdomen.  Tender to palpation diffusely, but predominantly suprapubic. GU: Foley in place.  Urine appears nonbloody Extremities: Cachectic appearing extremities.  No edema.  Laboratory: Recent Labs  Lab 02/04/20 1234 02/04/20 1257 02/04/20 2222  WBC 23.8*  --  20.9*  HGB 15.8 17.0  16.7 14.6  HCT 51.3 50.0  49.0 43.4  PLT 330  --  274   Recent Labs  Lab 02/04/20 1234 02/04/20 1257 02/05/20 0706 02/05/20 0924 02/05/20 1800  NA 145   < > 152* 152* 150*  K 4.2   < > 4.1 3.7 3.6  CL 105   < > 124* 126* 122*  CO2 <7*   < > 18* 17* 16*  BUN 40*   < > 29* 26* 22*  CREATININE 2.59*   < > 1.53* 1.47* 1.38*  CALCIUM 9.1   < > 8.7* 8.6* 8.3*  PROT 7.8  --   --   --  6.0*  BILITOT 1.9*  --   --   --  0.6  ALKPHOS 88  --   --   --  61  ALT 12  --   --   --  10  AST 15  --   --   --  15   GLUCOSE 725*   < > 217* 207* 348*   < > = values in this interval not displayed.     Imaging/Diagnostic Tests: DG Chest 1 View  Result Date: 02/04/2020 CLINICAL DATA:  Shortness of breath. COVID positive. EXAM: CHEST  1 VIEW COMPARISON:  01/28/2020 FINDINGS: Patchy opacities within both mid and lower lungs are noted and suspicious for infection. The cardiomediastinal silhouette is unremarkable. No pleural effusion, mass or pneumothorax. No acute bony abnormalities are identified. IMPRESSION: Patchy opacities within both lungs, indeterminate but suspicious for infection. Electronically Signed   By: Margarette Canada M.D.   On: 02/04/2020 13:03    Gladys Damme, MD 02/06/2020, 5:17 AM PGY-1, [place] Intern pager: [phone], text pages welcome

## 2020-02-07 LAB — BASIC METABOLIC PANEL
Anion gap: 10 (ref 5–15)
BUN: 12 mg/dL (ref 6–20)
CO2: 21 mmol/L — ABNORMAL LOW (ref 22–32)
Calcium: 7.3 mg/dL — ABNORMAL LOW (ref 8.9–10.3)
Chloride: 117 mmol/L — ABNORMAL HIGH (ref 98–111)
Creatinine, Ser: 0.96 mg/dL (ref 0.61–1.24)
GFR calc Af Amer: >60 mL/min (ref >60)
GFR calc non Af Amer: >60 mL/min (ref >60)
Glucose, Bld: 147 mg/dL — ABNORMAL HIGH (ref 70–99)
Potassium: 2.8 mmol/L — ABNORMAL LOW (ref 3.5–5.1)
Sodium: 148 mmol/L — ABNORMAL HIGH (ref 135–145)

## 2020-02-07 LAB — COMPREHENSIVE METABOLIC PANEL
ALT: 10 U/L (ref 0–44)
AST: 20 U/L (ref 15–41)
Albumin: 2 g/dL — ABNORMAL LOW (ref 3.5–5.0)
Alkaline Phosphatase: 65 U/L (ref 38–126)
Anion gap: 10 (ref 5–15)
BUN: 13 mg/dL (ref 6–20)
CO2: 20 mmol/L — ABNORMAL LOW (ref 22–32)
Calcium: 7.5 mg/dL — ABNORMAL LOW (ref 8.9–10.3)
Chloride: 121 mmol/L — ABNORMAL HIGH (ref 98–111)
Creatinine, Ser: 0.96 mg/dL (ref 0.61–1.24)
GFR calc Af Amer: >60 mL/min (ref >60)
GFR calc non Af Amer: >60 mL/min (ref >60)
Glucose, Bld: 228 mg/dL — ABNORMAL HIGH (ref 70–99)
Potassium: 2.8 mmol/L — ABNORMAL LOW (ref 3.5–5.1)
Sodium: 151 mmol/L — ABNORMAL HIGH (ref 135–145)
Total Bilirubin: 0.9 mg/dL (ref 0.3–1.2)
Total Protein: 5 g/dL — ABNORMAL LOW (ref 6.5–8.1)

## 2020-02-07 LAB — GLUCOSE, CAPILLARY
Glucose-Capillary: 236 mg/dL — ABNORMAL HIGH (ref 70–99)
Glucose-Capillary: 203 mg/dL — ABNORMAL HIGH (ref 70–99)
Glucose-Capillary: 217 mg/dL — ABNORMAL HIGH (ref 70–99)
Glucose-Capillary: 143 mg/dL — ABNORMAL HIGH (ref 70–99)
Glucose-Capillary: 149 mg/dL — ABNORMAL HIGH (ref 70–99)

## 2020-02-07 LAB — OSMOLALITY, URINE: Osmolality, Ur: 478 mOsm/kg (ref 300–900)

## 2020-02-07 LAB — OSMOLALITY: Osmolality: 315 mOsm/kg — ABNORMAL HIGH (ref 275–295)

## 2020-02-07 LAB — CBC
HCT: 34.7 % — ABNORMAL LOW (ref 39.0–52.0)
Hemoglobin: 11.8 g/dL — ABNORMAL LOW (ref 13.0–17.0)
MCH: 28.2 pg (ref 26.0–34.0)
MCHC: 34 g/dL (ref 30.0–36.0)
MCV: 82.8 fL (ref 80.0–100.0)
Platelets: 179 10*3/uL (ref 150–400)
RBC: 4.19 MIL/uL — ABNORMAL LOW (ref 4.22–5.81)
RDW: 13.3 % (ref 11.5–15.5)
WBC: 10.7 10*3/uL — ABNORMAL HIGH (ref 4.0–10.5)
nRBC: 0 % (ref 0.0–0.2)

## 2020-02-07 LAB — PROCALCITONIN: Procalcitonin: 0.21 ng/mL

## 2020-02-07 LAB — PHOSPHORUS: Phosphorus: 2 mg/dL — ABNORMAL LOW (ref 2.5–4.6)

## 2020-02-07 LAB — SODIUM, URINE, RANDOM: Sodium, Ur: 33 mmol/L

## 2020-02-07 MED ORDER — GLUCERNA SHAKE PO LIQD
237.0000 mL | Freq: Three times a day (TID) | ORAL | Status: DC
  Administered 2020-02-08 – 2020-02-11 (×4): 237 mL via ORAL

## 2020-02-07 MED ORDER — POTASSIUM PHOSPHATES 15 MMOLE/5ML IV SOLN
30.0000 mmol | Freq: Once | INTRAVENOUS | Status: AC
  Administered 2020-02-07: 30 mmol via INTRAVENOUS
  Filled 2020-02-07: qty 10

## 2020-02-07 MED ORDER — DEXTROSE 5 % IV SOLN: INTRAVENOUS | Status: DC

## 2020-02-07 MED ORDER — POTASSIUM CHLORIDE CRYS ER 20 MEQ PO TBCR
40.0000 meq | EXTENDED_RELEASE_TABLET | ORAL | Status: AC
  Administered 2020-02-07: 40 meq via ORAL
  Filled 2020-02-07 (×2): qty 2

## 2020-02-07 MED ORDER — PRO-STAT SUGAR FREE PO LIQD
30.0000 mL | Freq: Three times a day (TID) | ORAL | Status: DC
  Administered 2020-02-07 – 2020-02-14 (×10): 30 mL via ORAL
  Filled 2020-02-07 (×18): qty 30

## 2020-02-07 NOTE — Progress Notes (Signed)
Initial Nutrition Assessment   RD working remotely.  DOCUMENTATION CODES:   Not applicable  INTERVENTION:  Provide Glucerna Shake po TID, each supplement provides 220 kcal and 10 grams of protein.  Provide 30 ml Prostat po TID, each supplement provides 100 kcal and 15 grams of protein.   Monitor magnesium, potassium, and phosphorus daily for at least 3 days, MD to replete as needed, as pt is at risk for refeeding syndrome given prolonged little to no po for 8 days prior to admission.  NUTRITION DIAGNOSIS:   Increased nutrient needs related to acute illness as evidenced by estimated needs.  GOAL:   Patient will meet greater than or equal to 90% of their needs  MONITOR:   PO intake, Weight trends, Supplement acceptance, Skin, Diet advancement, Labs, I & O's  REASON FOR ASSESSMENT:   Consult Assessment of nutrition requirement/status  ASSESSMENT:   54 y.o. male presenting with decreased PO intake, emesis, and hypothermia. PMH is significant for recent COVID infection, T2DM, HTN, HLD, Alcoholism, and homelessness. Recently diagnosed with covid, which likely triggered his DKA.  Pt unavailable during attempted time of contact via inpatient room phone. Per MD, pt experienced 8 days of emesis and decreased po intake with last full meal on 2/20. Pt at risk for refeeding syndrome. Pt is currently on a full liquid diet. Pt with a 4.5% weight loss within 1 week, which is significant for time frame. RD to order nutritional supplements to aid in caloric and protein needs.   Unable to complete Nutrition-Focused physical exam at this time.   Labs and medications reviewed.   Diet Order:   Diet Order            Diet full liquid Room service appropriate? Yes; Fluid consistency: Thin  Diet effective now              EDUCATION NEEDS:   Not appropriate for education at this time  Skin:  Skin Assessment: Reviewed RN Assessment  Last BM:  3/1  Height:   Ht Readings from Last 1  Encounters:  02/04/20 5\' 7"  (1.702 m)    Weight:   Wt Readings from Last 1 Encounters:  02/04/20 57.2 kg    Ideal Body Weight:  67.27 kg  BMI:  Body mass index is 19.73 kg/m.  Estimated Nutritional Needs:   Kcal:  1800-2000  Protein:  85-100 grams  Fluid:  >/= 1.8 L/day    02/06/20, MS, RD, LDN RD pager number/after hours weekend pager number on Amion.

## 2020-02-07 NOTE — Plan of Care (Signed)
  Problem: Clinical Measurements: Goal: Ability to maintain clinical measurements within normal limits will improve Outcome: Progressing Goal: Will remain free from infection Outcome: Progressing Goal: Diagnostic test results will improve Outcome: Progressing Goal: Respiratory complications will improve Outcome: Progressing Goal: Cardiovascular complication will be avoided Outcome: Progressing   Problem: Elimination: Goal: Will not experience complications related to bowel motility Outcome: Progressing   Problem: Safety: Goal: Ability to remain free from injury will improve Outcome: Progressing   Problem: Skin Integrity: Goal: Risk for impaired skin integrity will decrease Outcome: Progressing   Problem: Respiratory: Goal: Will maintain a patent airway Outcome: Progressing Goal: Complications related to the disease process, condition or treatment will be avoided or minimized Outcome: Progressing   Problem: Cardiac: Goal: Ability to maintain an adequate cardiac output will improve Outcome: Progressing   Problem: Health Behavior/Discharge Planning: Goal: Ability to identify and utilize available resources and services will improve Outcome: Progressing Goal: Ability to manage health-related needs will improve Outcome: Progressing   Problem: Metabolic: Goal: Ability to maintain appropriate glucose levels will improve Outcome: Progressing   Problem: Respiratory: Goal: Will regain and/or maintain adequate ventilation Outcome: Progressing

## 2020-02-07 NOTE — Progress Notes (Addendum)
Responded to consult for PIV. PIV obtained. Reviewed current IV medications and vascular access. Recommend considering central line for continued use of current medication due to risk of extravasation. RN made aware.

## 2020-02-07 NOTE — Plan of Care (Signed)
Problem: Education: Goal: Knowledge of General Education information will improve Description: Including pain rating scale, medication(s)/side effects and non-pharmacologic comfort measures 02/07/2020 0449 by Janne Lab, RN Outcome: Progressing 02/06/2020 2252 by Janne Lab, RN Outcome: Not Progressing   Problem: Health Behavior/Discharge Planning: Goal: Ability to manage health-related needs will improve 02/07/2020 0449 by Janne Lab, RN Outcome: Progressing 02/06/2020 2252 by Janne Lab, RN Outcome: Not Progressing   Problem: Clinical Measurements: Goal: Ability to maintain clinical measurements within normal limits will improve 02/07/2020 0449 by Janne Lab, RN Outcome: Progressing 02/06/2020 2252 by Janne Lab, RN Outcome: Progressing Goal: Will remain free from infection 02/07/2020 0449 by Janne Lab, RN Outcome: Progressing 02/06/2020 2252 by Janne Lab, RN Outcome: Progressing Goal: Diagnostic test results will improve 02/07/2020 0449 by Janne Lab, RN Outcome: Progressing 02/06/2020 2252 by Janne Lab, RN Outcome: Progressing Goal: Respiratory complications will improve 02/07/2020 0449 by Janne Lab, RN Outcome: Progressing 02/06/2020 2252 by Janne Lab, RN Outcome: Progressing Goal: Cardiovascular complication will be avoided 02/07/2020 0449 by Janne Lab, RN Outcome: Progressing 02/06/2020 2252 by Janne Lab, RN Outcome: Progressing   Problem: Activity: Goal: Risk for activity intolerance will decrease 02/07/2020 0449 by Janne Lab, RN Outcome: Progressing 02/06/2020 2252 by Janne Lab, RN Outcome: Progressing   Problem: Nutrition: Goal: Adequate nutrition will be maintained 02/07/2020 0449 by Janne Lab, RN Outcome: Progressing 02/06/2020 2252 by Janne Lab, RN Outcome: Not Progressing   Problem: Coping: Goal: Level of anxiety will decrease 02/07/2020 0449 by Janne Lab,  RN Outcome: Progressing 02/06/2020 2252 by Janne Lab, RN Outcome: Not Progressing   Problem: Elimination: Goal: Will not experience complications related to bowel motility 02/07/2020 0449 by Janne Lab, RN Outcome: Progressing 02/06/2020 2252 by Janne Lab, RN Outcome: Progressing Goal: Will not experience complications related to urinary retention 02/07/2020 0449 by Janne Lab, RN Outcome: Progressing 02/06/2020 2252 by Janne Lab, RN Outcome: Not Progressing   Problem: Pain Managment: Goal: General experience of comfort will improve 02/07/2020 0449 by Janne Lab, RN Outcome: Progressing 02/06/2020 2252 by Janne Lab, RN Outcome: Not Progressing   Problem: Safety: Goal: Ability to remain free from injury will improve 02/07/2020 0449 by Janne Lab, RN Outcome: Progressing 02/06/2020 2252 by Janne Lab, RN Outcome: Progressing   Problem: Skin Integrity: Goal: Risk for impaired skin integrity will decrease 02/07/2020 0449 by Janne Lab, RN Outcome: Progressing 02/06/2020 2252 by Janne Lab, RN Outcome: Progressing   Problem: Education: Goal: Knowledge of risk factors and measures for prevention of condition will improve Outcome: Progressing   Problem: Coping: Goal: Psychosocial and spiritual needs will be supported Outcome: Progressing   Problem: Respiratory: Goal: Will maintain a patent airway Outcome: Progressing Goal: Complications related to the disease process, condition or treatment will be avoided or minimized Outcome: Progressing   Problem: Education: Goal: Ability to describe self-care measures that may prevent or decrease complications (Diabetes Survival Skills Education) will improve Outcome: Progressing Goal: Individualized Educational Video(s) Outcome: Progressing   Problem: Cardiac: Goal: Ability to maintain an adequate cardiac output will improve Outcome: Progressing   Problem: Health  Behavior/Discharge Planning: Goal: Ability to identify and utilize available resources and services will improve Outcome: Progressing Goal: Ability to manage health-related needs will improve Outcome: Progressing   Problem: Fluid Volume: Goal: Ability to achieve a balanced intake and output will improve Outcome: Progressing  Problem: Metabolic: Goal: Ability to maintain appropriate glucose levels will improve Outcome: Progressing   Problem: Nutritional: Goal: Maintenance of adequate nutrition will improve Outcome: Progressing Goal: Maintenance of adequate weight for body size and type will improve Outcome: Progressing   Problem: Respiratory: Goal: Will regain and/or maintain adequate ventilation Outcome: Progressing   Problem: Urinary Elimination: Goal: Ability to achieve and maintain adequate renal perfusion and functioning will improve Outcome: Progressing

## 2020-02-07 NOTE — Progress Notes (Signed)
Brief note: Will cosign the resident's note once it is completed.   He seems to be improving gradually.  A/P: DM2 with DKA: DKA resolved. Now on Lantus 15 units qd with SSI Consult dabetic educator.  COVID infection with sepsis presentation: Resolved sepsis. Afebrile and his WBC is down to 10. He does not require oxygenation. Continue Remdesivir.   Hypernatremia I agree with fluid adjustment. Monitor sodium level closely. Correct other abnormal electrolytes.  Abdominal pain: Improved. CT shows Gastritis. ?? Mild Pancreatitis which has now resolved given previously elevated lipase. Continue PPI. GI F/U as outpatient as recommended by them.

## 2020-02-07 NOTE — Progress Notes (Signed)
Hemoglobin stable at 11.8, BUN normal at 13.  WBC has dropped from 21K to 11 K over the past 3 days.  Lipase has similarly dropped from 271 to 32 during that time; presumably, this reflects improvement in renal function rather than resolving pancreatitis, since the CT showed a normal pancreas.  The patient is very difficult to get a clear history from, but abdomen seems to be nontender.  From talking with his nurse, it sounds as though he is tolerating small amounts of water.   Impression: Probable DKA induced nausea vomiting, perhaps with associated gastritis  Recommendations: Agree with continued IV high-dose Protonix until nausea and dyspepsia have resolved.  Not clear that the patient will need any ongoing PPI therapy following discharge.  Agree with dietary advancement.  We will sign off, but please call us if further input from [place] is desired.  Florencia Reasons, M.D. Pager [phone] If no answer or after 5 PM call [phone]

## 2020-02-07 NOTE — Progress Notes (Signed)
Family Medicine Teaching Service Daily Progress Note Intern Pager: [phone]  Patient name: Steven Harvey Medical record number: [id number] Date of birth: 1966-10-01 Age: 54 y.o. Gender: male  Primary Care Provider: Patient, No Pcp Per Consultants: None Code Status: Full  Pt Overview and Major Events to Date:  2/27-admitted 2/28-transitioned off Endo tool  Assessment and Plan: Steven Harvey is a 54 y.o. male presenting with decreased PO intake, emesis, and hypothermia. PMH is significant for recent COVID infection, T2DM, HTN, HLD, Alcoholism, and homelessness.   DKA  T2DM Found down in a hotel room (self isolating for Covid) after several episodes of vomiting and decreased PO intake x1 week. Recently diagnosed with covid, which likely triggered his DKA. UDS negative. UA negative for infection.   Endorsed compliance with metformin, but A1c is 13.2 so either noncompliant or undertreated. Transitioned off Endo tool 2/28, but remained on dextrose containing fluids and n.p.o. due to poor mental status and concern for aspiration. SLP evaluation recommended starting a full liquid diet and advancing per patient preference. CBG in last 24 hours ranged from 170-323, patient received 26U aspart and glargine 10U yesterday. Most recent CBG 228 and patient received 6U aspart this morning. To receive 15U glargine this morning, plan to watch CBG today and alter regimen tomorrow pending CBG trend. Persistent Hypernatremia to 151, d/c 1/2NS and continue only D5 IVF. Hypokalemia to 2.8, repleting with kphos 48mmol IV, will recheck BMP at 1600 and replete as necessary. Hyperchloremia to 121. Hypophosphatemia to 2.0 (kphos to increase phos to 2.5). Will attempt voiding trial today. -Continue lantus to 15U daily -BMP 1600 -D5 IVF 58ml/hr -recheck temp; vital signs per protocol -continuous cardiac monitoring and pulse ox -FLD ADAT  COVID  SIRS  Presented w/ 4/4 SIRS criteria. Recently tested positive for COVID  on 01/28/2020. No indications of bacterial infection.  He has experienced 8 days of emesis and decreased p.o. intake stating his last meal was on 01/28/20. CXR w/ patchy opacities within both lungs, indeterminate but suspicious for infection. LA 4.4> 2.3.   This is likely the result of combination of Covid, DKA, and dehydration. Procalcitonin 0.28 and blood cultures and urine culture NG x2 days, bacterial process highly unlikely. Satting well on RA. AST/ALT WNL. -Isolation precaution (Airborne and contact)  -Remdesivir day 3/5 -Daily CMP -monitor fever -cardiac monitoring, pulse ox   Hypernatremia Na 151 today despite D51/2NS yesterday. IVF now D5 only, 73mL/hr. Free water deficit 2.7L. Urine osmoles pending. -BMP 1600 -D5 IVF 75 mL/hr  hematemasis  Occurred overnight on 2/27.  Patient has not had any further emesis overnight last night, but still complaining of abdominal tenderness in the epigastric region.  Uncertain if this is related to mucosal tear or gastric ulcers.  In the setting of recent history of excessive vomiting, more likely a tear. H/o of NSAID use, currently on protonix. CT abdomen indicative of gastritis. Will restart lovenox tomorrow pending GI recs. -GI consulted, appreciate recommendations. - lovenox stopped -40 mg twice daily Protonix started -FLD, ADAT  Cachexia  malnutrition  BMI 19.73. albumin 3.2.   -Nutrition consulted, awaiting recommendations -FLD, ADAT  Prolonged QTc 513.  Avoid QTc prolonging medications.    AKI- resolved Acute, stable. Patient with serum creatinine of 2.59 on admission with baseline of 1.08 from previous admissions cr today 0.96.   - Cont IVF: D5 IVF at 75 ml/hr -Daily BMP - avoiding nephrotoxic medications  HTN Normotensive to hypertensive: SBP 134-166, DBP 72-116, most recently 134/72.  Endorses medication compliance with  Lisinopril 5mg  daily. -Hold home meds while NPO  HLD Lipid panel total cholesterol 220, HDL 40, LDL 124,  triglycerides 281. High intensity statin recommended due to diabetes and ASCVD risk of 14% in the next 10 years. -Recommend starting rosuvastatin 20mg  daily when able to eat.  Alcoholism Endorses last alcoholic beverage was 4 months ago.  AST/ALT 15/12. CIWA not documented overnight.  -CIWA w/o ativan  Homelessness Currently living out of a motel due to isolating for Covid.  Was previously in a group home. -TOC consult   FEN/GI: NPO Prophylaxis: Lovenox  Disposition: Home pending improvement  Subjective:  Patient still having epigastric pain today, no emesis overnight. Still low appetite due to nausea.  Objective: Temp:  [97.8 F (36.6 C)-98.2 F (36.8 C)] 98 F (36.7 C) (03/02 0400) Pulse Rate:  [75-110] 86 (03/02 0500) Resp:  [15-23] 18 (03/02 0500) BP: (117-145)/(79-92) 133/86 (03/02 0400) SpO2:  [97 %-99 %] 97 % (03/02 0500) Physical Exam: General: older, then AA man resting in bed. NAD, but feels very uncomfortable  Cardiovascular: rrr, no m/r/g Respiratory: diminished air flow in all lung fields, no crackles appreciated, no increased WOB Abdomen: Scaphoid abdomen.  Tender to palpation diffusely, but predominantly epigastric GU: Foley in place.  Urine appears nonbloody Extremities: Cachectic appearing extremities.  No edema.  Laboratory: Recent Labs  Lab 02/04/20 2222 02/06/20 0651 02/07/20 0638  WBC 20.9* 17.2* 10.7*  HGB 14.6 11.8* 11.8*  HCT 43.4 34.5* 34.7*  PLT 274 209 179   Recent Labs  Lab 02/04/20 1234 02/04/20 1257 02/05/20 1800 02/06/20 0651 02/07/20 0638  NA 145   < > 150* 151* 151*  K 4.2   < > 3.6 3.0* 2.8*  CL 105   < > 122* 123* 121*  CO2 <7*   < > 16* 17* 20*  BUN 40*   < > 22* 11 13  CREATININE 2.59*   < > 1.38* 1.06 0.96  CALCIUM 9.1   < > 8.3* 8.1* 7.5*  PROT 7.8  --  6.0*  --  5.0*  BILITOT 1.9*  --  0.6  --  0.9  ALKPHOS 88  --  61  --  65  ALT 12  --  10  --  10  AST 15  --  15  --  20  GLUCOSE 725*   < > 348* 268* 228*    < > = values in this interval not displayed.     Imaging/Diagnostic Tests: DG Chest 1 View  Result Date: 02/04/2020 CLINICAL DATA:  Shortness of breath. COVID positive. EXAM: CHEST  1 VIEW COMPARISON:  01/28/2020 FINDINGS: Patchy opacities within both mid and lower lungs are noted and suspicious for infection. The cardiomediastinal silhouette is unremarkable. No pleural effusion, mass or pneumothorax. No acute bony abnormalities are identified. IMPRESSION: Patchy opacities within both lungs, indeterminate but suspicious for infection. Electronically Signed   By: 02/06/2020 M.D.   On: 02/04/2020 13:03   CT ABDOMEN PELVIS W CONTRAST  Result Date: 02/06/2020 CLINICAL DATA:  54 year old male with sepsis and generalized abdominal pain. Positive COVID-19. EXAM: CT ABDOMEN AND PELVIS WITH CONTRAST TECHNIQUE: Multidetector CT imaging of the abdomen and pelvis was performed using the standard protocol following bolus administration of intravenous contrast. CONTRAST:  04/07/2020 OMNIPAQUE IOHEXOL 300 MG/ML  SOLN COMPARISON:  CT of the chest abdomen pelvis dated 01/28/2020. FINDINGS: Evaluation of this exam is limited due to respiratory motion artifact. Lower chest: Partially visualized small bilateral pleural effusions. Diffuse bilateral lower  lobe and lingula confluent densities most consistent with multifocal pneumonia and in keeping with COVID-19. No intra-abdominal free air. Trace free fluid in the pelvis. Hepatobiliary: No focal liver abnormality is seen. No gallstones, gallbladder wall thickening, or biliary dilatation. Pancreas: Unremarkable. No pancreatic ductal dilatation or surrounding inflammatory changes. Spleen: Normal in size without focal abnormality. Adrenals/Urinary Tract: The adrenal glands, kidneys, and the visualized ureters appear unremarkable. The urinary bladder is partially distended despite presence of a Foley catheter. Air within the bladder likely introduced via catheter. There is apparent  diffuse thickening of the bladder wall which may be partly related to underdistention. Cystitis is not excluded. Correlation with urinalysis recommended. Stomach/Bowel: There is diffuse thickened and edematous appearance of the gastric wall concerning for gastritis. Clinical correlation is recommended. There is no bowel obstruction. The appendix is normal. Vascular/Lymphatic: The abdominal aorta and IVC are unremarkable. No portal venous gas. There is no adenopathy. Reproductive: The prostate and seminal vesicles are grossly unremarkable. Other: None Musculoskeletal: No acute or significant osseous findings. IMPRESSION: 1. Findings concerning for gastritis. Clinical correlation is recommended. No bowel obstruction. Normal appendix. 2. Partially visualized small bilateral pleural effusions and multifocal pneumonia. Electronically Signed   By: Elgie Collard M.D.   On: 02/06/2020 20:02    Shirlean Mylar, MD 02/07/2020, 8:41 AM PGY-1, [facility] Family Medicine FPTS Intern pager: [phone], text pages welcome

## 2020-02-07 NOTE — Progress Notes (Signed)
Inpatient Diabetes Program Recommendations  AACE/ADA: New Consensus Statement on Inpatient Glycemic Control (2015)  Target Ranges:  Prepandial:   less than 140 mg/dL      Peak postprandial:   less than 180 mg/dL (1-2 hours)      Critically ill patients:  140 - 180 mg/dL   Lab Results  Component Value Date   GLUCAP 217 (H) 02/07/2020   HGBA1C 13.2 (H) 02/04/2020    Attempted to speak with pt over the phone regarding A1c of 13.2%. no answer. Spoke with Ginger, Charity fundraiser. Pt is not accepting phone calls at this time due to not wanting to talk to anyone. Pt seems depressed. Will try to attempt everyday to speak with pt before d/c.  At time of d/c since pt is homeless and unsure of PO intake may try metformin and a sulfonylurea at time of d/c such as glimepiride (potential for lower doses). Do not want to try insulin as pt is depressed and the environment may turn the insulin properties.  Thanks,  Christena Deem RN, MSN, BC-ADM Inpatient Diabetes Coordinator Team Pager [phone] (8a-5p)

## 2020-02-08 LAB — BASIC METABOLIC PANEL
Anion gap: 9 (ref 5–15)
BUN: 12 mg/dL (ref 6–20)
CO2: 25 mmol/L (ref 22–32)
Calcium: 7.3 mg/dL — ABNORMAL LOW (ref 8.9–10.3)
Chloride: 109 mmol/L (ref 98–111)
Creatinine, Ser: 0.91 mg/dL (ref 0.61–1.24)
GFR calc Af Amer: >60 mL/min (ref >60)
GFR calc non Af Amer: >60 mL/min (ref >60)
Glucose, Bld: 152 mg/dL — ABNORMAL HIGH (ref 70–99)
Potassium: 2.6 mmol/L — CL (ref 3.5–5.1)
Sodium: 143 mmol/L (ref 135–145)

## 2020-02-08 LAB — COMPREHENSIVE METABOLIC PANEL
ALT: 11 U/L (ref 0–44)
AST: 17 U/L (ref 15–41)
Albumin: 1.8 g/dL — ABNORMAL LOW (ref 3.5–5.0)
Alkaline Phosphatase: 64 U/L (ref 38–126)
Anion gap: 12 (ref 5–15)
BUN: 13 mg/dL (ref 6–20)
CO2: 22 mmol/L (ref 22–32)
Calcium: 7.2 mg/dL — ABNORMAL LOW (ref 8.9–10.3)
Chloride: 110 mmol/L (ref 98–111)
Creatinine, Ser: 0.86 mg/dL (ref 0.61–1.24)
GFR calc Af Amer: >60 mL/min (ref >60)
GFR calc non Af Amer: >60 mL/min (ref >60)
Glucose, Bld: 229 mg/dL — ABNORMAL HIGH (ref 70–99)
Potassium: 2.9 mmol/L — ABNORMAL LOW (ref 3.5–5.1)
Sodium: 144 mmol/L (ref 135–145)
Total Bilirubin: 0.7 mg/dL (ref 0.3–1.2)
Total Protein: 5 g/dL — ABNORMAL LOW (ref 6.5–8.1)

## 2020-02-08 LAB — CBC
HCT: 35 % — ABNORMAL LOW (ref 39.0–52.0)
Hemoglobin: 11.8 g/dL — ABNORMAL LOW (ref 13.0–17.0)
MCH: 28 pg (ref 26.0–34.0)
MCHC: 33.7 g/dL (ref 30.0–36.0)
MCV: 83.1 fL (ref 80.0–100.0)
Platelets: 173 10*3/uL (ref 150–400)
RBC: 4.21 MIL/uL — ABNORMAL LOW (ref 4.22–5.81)
RDW: 13.2 % (ref 11.5–15.5)
WBC: 8.7 10*3/uL (ref 4.0–10.5)
nRBC: 0 % (ref 0.0–0.2)

## 2020-02-08 LAB — GLUCOSE, CAPILLARY
Glucose-Capillary: 125 mg/dL — ABNORMAL HIGH (ref 70–99)
Glucose-Capillary: 150 mg/dL — ABNORMAL HIGH (ref 70–99)
Glucose-Capillary: 184 mg/dL — ABNORMAL HIGH (ref 70–99)
Glucose-Capillary: 197 mg/dL — ABNORMAL HIGH (ref 70–99)
Glucose-Capillary: 205 mg/dL — ABNORMAL HIGH (ref 70–99)
Glucose-Capillary: 239 mg/dL — ABNORMAL HIGH (ref 70–99)

## 2020-02-08 LAB — PHOSPHORUS: Phosphorus: 4 mg/dL (ref 2.5–4.6)

## 2020-02-08 LAB — MAGNESIUM
Magnesium: 1.7 mg/dL (ref 1.7–2.4)
Magnesium: 1.8 mg/dL (ref 1.7–2.4)

## 2020-02-08 LAB — C-REACTIVE PROTEIN: CRP: 10.8 mg/dL — ABNORMAL HIGH (ref <1.0)

## 2020-02-08 LAB — FERRITIN: Ferritin: 574 ng/mL — ABNORMAL HIGH (ref 24–336)

## 2020-02-08 LAB — D-DIMER, QUANTITATIVE: D-Dimer, Quant: 13.72 ug/mL-FEU — ABNORMAL HIGH (ref 0.00–0.50)

## 2020-02-08 MED ORDER — PROSIGHT PO TABS
1.0000 | ORAL_TABLET | Freq: Every day | ORAL | Status: DC
  Administered 2020-02-09 – 2020-02-22 (×13): 1 via ORAL
  Filled 2020-02-08 (×15): qty 1

## 2020-02-08 MED ORDER — ENOXAPARIN SODIUM 40 MG/0.4ML ~~LOC~~ SOLN
40.0000 mg | Freq: Two times a day (BID) | SUBCUTANEOUS | Status: DC
  Administered 2020-02-08 – 2020-02-09 (×2): 40 mg via SUBCUTANEOUS
  Filled 2020-02-08 (×3): qty 0.4

## 2020-02-08 MED ORDER — SODIUM CHLORIDE 0.9 % IV SOLN
INTRAVENOUS | Status: DC | PRN
  Administered 2020-02-09 (×2): 250 mL via INTRAVENOUS

## 2020-02-08 MED ORDER — MAGNESIUM SULFATE 2 GM/50ML IV SOLN
2.0000 g | Freq: Once | INTRAVENOUS | Status: AC
  Administered 2020-02-08: 2 g via INTRAVENOUS
  Filled 2020-02-08: qty 50

## 2020-02-08 MED ORDER — POTASSIUM CHLORIDE 10 MEQ/100ML IV SOLN
10.0000 meq | INTRAVENOUS | Status: DC
  Administered 2020-02-08: 10 meq via INTRAVENOUS
  Filled 2020-02-08 (×4): qty 100

## 2020-02-08 MED ORDER — LACTATED RINGERS IV SOLN: INTRAVENOUS | Status: DC

## 2020-02-08 MED ORDER — POTASSIUM CHLORIDE CRYS ER 20 MEQ PO TBCR
40.0000 meq | EXTENDED_RELEASE_TABLET | ORAL | Status: DC
  Administered 2020-02-08: 40 meq via ORAL
  Filled 2020-02-08 (×2): qty 2

## 2020-02-08 MED ORDER — POTASSIUM CHLORIDE 10 MEQ/100ML IV SOLN
10.0000 meq | INTRAVENOUS | Status: AC
  Administered 2020-02-09 (×2): 10 meq via INTRAVENOUS

## 2020-02-08 MED ORDER — SODIUM CHLORIDE 0.9 % IV SOLN: INTRAVENOUS | Status: DC

## 2020-02-08 MED ORDER — POTASSIUM CHLORIDE CRYS ER 20 MEQ PO TBCR
40.0000 meq | EXTENDED_RELEASE_TABLET | Freq: Once | ORAL | Status: AC
  Administered 2020-02-08: 40 meq via ORAL
  Filled 2020-02-08: qty 2

## 2020-02-08 NOTE — Progress Notes (Addendum)
Physical Therapy Treatment Patient Details Name: Steven Harvey MRN: [id number] DOB: 25-Aug-1966 Today's Date: 02/08/2020    History of Present Illness Pt is a 54 y.o. male with recent COVID-19 dx, admitted 02/04/20 fatigue, hypothermia and gastritis. Found to have DKA. CXR with likely PNA. PMH includes DM2, HTN, HLD.    PT Comments    Pt lying supine in bed on room air on entry. Pt reports feeling a little nauseous but better than before. Pt does not prefer to get up OOB however is cooperative with encouragement. At first pt requires min assist x 2 for supine to sit to help bring hand over to handrail and to move LEs. Once pt's hand is on bed rail and legs have been moved over to the side of the bed, pt is able to quickly sit up independently. Pt demonstrates good trunk control sitting EOB. Pt is set up for a stand pivot with RW. However, pt states "I don't need that. I can do it on my own." He then transfers himself stand pivot to recliner with AD with min guard for safety and line management. Pt is encouraged to go on a short walk however he refuses stating "let me relax." Pt likely has improved functional mobility than he has shown thus far however his participation is limited and pt tends to not want to be bothered. D/C plans remain appropriate at this time, however if pt continued to demonstrate improved functional mobility over the next few days, d/c location can likely change to home with [facility]. PT will continue to follow acutely.     Follow Up Recommendations  SNF;Supervision for mobility/OOB     Equipment Recommendations  Other (comment)(TBD)       Precautions / Restrictions Precautions Precautions: Fall;Other (comment)(airborne) Precaution Comments: nausea/ vomiting, pt able to do more than he's showing. He needs encouragement to participate Restrictions Weight Bearing Restrictions: No    Mobility  Bed Mobility Overal bed mobility: Needs Assistance Bed Mobility: Supine to Sit      Supine to sit: Min assist;+2 for safety/equipment;HOB elevated     General bed mobility comments: Pt required assistance to intiate movement and sequence tasks. Once pt's legs were moved over to the EOB and hand was on handrail, he shot up in bed independently without demonstrating difficulty. Pt able to scoot up to [facility] independently.  Transfers Overall transfer level: Needs assistance Equipment used: None Transfers: Stand Pivot Transfers   Stand pivot transfers: Min guard       General transfer comment: Pt was set up for a stand pivot with RW however he reports "I don't need that, I can do it on my own." He's able to independently transfer himself stand pivot to the recliner without AD. Min guard for safety and line management.  Ambulation/Gait             General Gait Details: Pt refuses to ambulate with therapist.           Balance Overall balance assessment: Needs assistance Sitting-balance support: Feet supported Sitting balance-Leahy Scale: Good Sitting balance - Comments: Pt able to sit EOB demonstrating good trunk control. He is able to scoot sideways on bed independently.    Standing balance support: No upper extremity supported Standing balance-Leahy Scale: Fair Standing balance comment: Pt quickly stand pivots from bed>chair. Unable to determine true standing balance at this time due to pt's limited participation. When stand pivoting pt demonstrates fair standing balance and does not require an AD.  Cognition Arousal/Alertness: Lethargic Behavior During Therapy: Flat affect(slightly frustrated due to not wanting to participate) Overall Cognitive Status: Difficult to assess                                 General Comments: Difficult to assess if true cognitive impairment vs fatigue vs not feeling well. pt seemed frustrated by being asked questions, repeating, "Just let me relax." Not consistently following  commands         General Comments General comments (skin integrity, edema, etc.): Pt on room air on entry. Vitals WNL. Pt is fatigued and complains of mild nausea at end of treatment. Pt demonstrates fair functional mobility throughout treatment however requires frequent cuing and encouragement to participate. He is unwilling to ambulate or do exercises. He seems to want to be left alone.      Pertinent Vitals/Pain Pain Assessment: No/denies pain           PT Goals (current goals can now be found in the care plan section) Acute Rehab PT Goals Patient Stated Goal: "Just let me relax" PT Goal Formulation: With patient Time For Goal Achievement: 02/19/20 Potential to Achieve Goals: Fair Progress towards PT goals: Progressing toward goals    Frequency    Min 3X/week      PT Plan Current plan remains appropriate;Other (comment)(Pt may be safe to go home w/HH however need more info)       AM-PAC PT "6 Clicks" Mobility   Outcome Measure  Help needed turning from your back to your side while in a flat bed without using bedrails?: A Lot Help needed moving from lying on your back to sitting on the side of a flat bed without using bedrails?: A Lot Help needed moving to and from a bed to a chair (including a wheelchair)?: A Little Help needed standing up from a chair using your arms (e.g., wheelchair or bedside chair)?: A Little Help needed to walk in hospital room?: A Lot Help needed climbing 3-5 steps with a railing? : A Lot 6 Click Score: 14    End of Session Equipment Utilized During Treatment: Gait belt Activity Tolerance: Patient tolerated treatment well Patient left: in chair;with call bell/phone within reach;with chair alarm set Nurse Communication: Mobility status;Other (comment)(requesting nausea medicine) PT Visit Diagnosis: Other abnormalities of gait and mobility (R26.89);Muscle weakness (generalized) (M62.81)     Time: 3500-9381 PT Time Calculation (min)  (ACUTE ONLY): 26 min  Charges:  $Therapeutic Activity: 23-37 mins                     Jodelle Green, PT, DPT Acute Rehabilitation Services Office [phone]   Jodelle Green 02/08/2020, 1:44 PM

## 2020-02-08 NOTE — Progress Notes (Signed)
OT Cancellation Note  Patient Details Name: Vail Vuncannon MRN: [id number] DOB: 1966/08/29   Cancelled Treatment:    Reason Eval/Treat Not Completed: Patient declined, no reason specified;Other (comment)(Pt refused despite education on importance. Pt requesting OT to check back in one hour, then "later" today, citing preference to drink water and lay in bed.)  Lorre Munroe 02/08/2020, 11:13 AM

## 2020-02-08 NOTE — Progress Notes (Signed)
RN paged this provider at 5:14 PM that patient was complaining of increased nausea and would not drink second oral dose of potassium . EKG obtained to assess QTc, previously prolonged to 523 on admission. On review of EKG now, QTc prolonged to 639. No QT prolonging agents on board. K down to 2.9 this morning, patient has previously extravasated lines with IV potassium. Repleted this morning with klor-con 40 mEqx1. Patient refused 2nd and 3rd doses due to nausea. Unfortunately cannot treat his nausea at the moment d/t QTc prolongation.   Mg this morning 1.7, 2g MgSO4 IV ordered. Repeat BMP now to assess potassium, will attempt to replete IV.  Shirlean Mylar, MD [facility] Family Medicine Residency, PGY-1

## 2020-02-08 NOTE — Progress Notes (Signed)
Patient complaining of pain at IV site.  Pt receiving IV potassium x4 due to K 2.6.  Educated the patient on importance of potassium repletion.  Pt continued to request infusion be stopped.  Pt also requested something for nausea.  I explained to the patient that we cannot treat his nausea until we correct his potassium.  I asked patient if he would be agreeable to potassium running at a slower rate, pt agreed to try this.  Paged FMTS on-call provider Dr. Sharol Harness, who agreed that potassium could run at 50cc/hr rather than the ordered rate of 100cc/hr.  Infusion rate changed to 50cc/hr.  Potassium is also running with D5% solution @ 75cc/hr.

## 2020-02-08 NOTE — Progress Notes (Signed)
Family Medicine Teaching Service Daily Progress Note Intern Pager: [phone]  Patient name: Steven Harvey Medical record number: [id number] Date of birth: [birth year] Age: 54 y.o. Gender: male  Primary Care Provider: Patient, No Pcp Per Consultants: None Code Status: Full  Pt Overview and Major Events to Date:  2/27-admitted 2/28-transitioned off Endo tool  Assessment and Plan: Steven Harvey is a 54 y.o. male presenting with decreased PO intake, emesis, and hypothermia. PMH is significant for recent COVID infection, T2DM, HTN, HLD, Alcoholism, and homelessness.   DKA  T2DM Transitioned off Endo tool 2/28, but remained on dextrose containing fluids due to poor PO intake, will wean down as patient able to tolerate more PO. Patient very uncomfortable, but mildly better than yesterday. SLP recommended starting a full liquid diet and advancing per patient preference. CBG in last 24 hours ranged from 217-149, patient received 13U aspart and glargine 15U yesterday. Na WNL at 144 today, K 2.9, repleted with Klcor-con 59mEqx1, refused second dose. Cl WNL at 110. -Continue lantus to 15U daily -D5 IVF 39ml/hr -Daily BMP -recheck temp; vital signs per protocol -continuous pulse ox -FLD ADAT  COVID  SIRS  Presented w/ 4/4 SIRS criteria. Recently tested positive for COVID on 01/28/2020. No indications of bacterial infection.  He has experienced 8 days of emesis and decreased p.o. intake stating his last meal was on 01/28/20. CXR w/ patchy opacities within both lungs, indeterminate but suspicious for infection. LA 4.4> 2.3.   This is likely the result of combination of Covid, DKA, and dehydration. Procalcitonin 0.28 and blood cultures and urine culture NG x2 days, bacterial process highly unlikely. Satting well on RA. AST/ALT WNL. -Isolation precaution (Airborne and contact)  -Remdesivir day 4/5 -Daily CMP -pulse ox   Hypernatremia- resolved Na 144. IVF D5 only, 5mL/hr, patient not tolerating PO.  Free water deficit 2 L. Urine osmoles >300, no concern for DI. Will hopefully be able to d/c IVF tomorrow if patient tolerates PO better today -Daily BMP -D5 IVF 75 mL/hr  hematemasis  Occurred overnight on 2/27.  Patient has not had any further emesis overnight last night, but still complaining of abdominal tenderness in the epigastric region.  Uncertain if this is related to mucosal tear or gastric ulcers.  In the setting of recent history of excessive vomiting, more likely a tear. H/o of NSAID use, currently on protonix. CT abdomen indicative of gastritis. D-dimer 13, Hgb stable at 11.8. Will restart lovenox today. GI reccommends no in house work up, can consider colonoscopy on discharge since unlikely to tolerate a bowel prep.  - Lovenox for DVT ppx -40 mg twice daily Protonix started -FLD, ADAT  Cachexia  malnutrition  BMI 19.73. albumin 3.2.   -Nutrition consulted, awaiting recommendations -FLD, ADAT  Prolonged QTc 513.  Avoid QTc prolonging medications.    AKI- resolved Acute, stable. Patient with serum creatinine of 2.59 on admission with baseline of 1.08 from previous admissions. cr today 0.86. - Cont IVF: D5 IVF at 75 ml/hr -Daily BMP - avoiding nephrotoxic medications  HTN Normotensive to hypertensive: SBP 109-130, DBP 72-83, most recently 109/72.  Endorses medication compliance with Lisinopril 5mg  daily. -Will hold lisinopril due to low normal BP  HLD Lipid panel total cholesterol 220, HDL 40, LDL 124, triglycerides 281. High intensity statin recommended due to diabetes and ASCVD risk of 14% in the next 10 years. -Will start rosuvastatin 20mg  daily   Alcoholism Endorses last alcoholic beverage was 4 months ago.  AST/ALT 15/12. CIWAs 0  Homelessness Currently  living out of a motel due to isolating for Covid.  Was previously in a group home. -TOC consult   FEN/GI: NPO Prophylaxis: Lovenox  Disposition: Home pending improvement  Subjective:  Patient  endorses that he feels a little better today, he still has nausea but reports it is better than yesterday. Abdomen also not as tender.  Objective: Temp:  [99.2 F (37.3 C)-99.7 F (37.6 C)] 99.6 F (37.6 C) (03/02 2021) Pulse Rate:  [84-96] 93 (03/03 0400) Resp:  [15-22] 22 (03/03 0400) BP: (109-130)/(72-88) 109/72 (03/03 0400) SpO2:  [94 %-100 %] 97 % (03/03 0400) Physical Exam: General: older, then AA man resting in bed. NAD, appears tired and uncomfortable Cardiovascular: rrr, no m/r/g Respiratory: diminished air flow in all lung fields, no crackles appreciated, no increased WOB Abdomen: Scaphoid abdomen. Mild tenderness to palpation in epigastric region Extremities: Cachectic appearing extremities.  No edema.  Laboratory: Recent Labs  Lab 02/06/20 0651 02/07/20 0638 02/08/20 0417  WBC 17.2* 10.7* 8.7  HGB 11.8* 11.8* 11.8*  HCT 34.5* 34.7* 35.0*  PLT 209 179 173   Recent Labs  Lab 02/04/20 1234 02/04/20 1257 02/05/20 1800 02/05/20 1800 02/06/20 0651 02/07/20 0638 02/07/20 1502  NA 145   < > 150*   < > 151* 151* 148*  K 4.2   < > 3.6   < > 3.0* 2.8* 2.8*  CL 105   < > 122*   < > 123* 121* 117*  CO2 <7*   < > 16*   < > 17* 20* 21*  BUN 40*   < > 22*   < > 11 13 12   CREATININE 2.59*   < > 1.38*   < > 1.06 0.96 0.96  CALCIUM 9.1   < > 8.3*   < > 8.1* 7.5* 7.3*  PROT 7.8  --  6.0*  --   --  5.0*  --   BILITOT 1.9*  --  0.6  --   --  0.9  --   ALKPHOS 88  --  61  --   --  65  --   ALT 12  --  10  --   --  10  --   AST 15  --  15  --   --  20  --   GLUCOSE 725*   < > 348*   < > 268* 228* 147*   < > = values in this interval not displayed.   Imaging/Diagnostic Tests: DG Chest 1 View  Result Date: 02/04/2020 CLINICAL DATA:  Shortness of breath. COVID positive. EXAM: CHEST  1 VIEW COMPARISON:  01/28/2020 FINDINGS: Patchy opacities within both mid and lower lungs are noted and suspicious for infection. The cardiomediastinal silhouette is unremarkable. No pleural  effusion, mass or pneumothorax. No acute bony abnormalities are identified. IMPRESSION: Patchy opacities within both lungs, indeterminate but suspicious for infection. Electronically Signed   By: 01/30/2020 M.D.   On: 02/04/2020 13:03   CT ABDOMEN PELVIS W CONTRAST  Result Date: 02/06/2020 CLINICAL DATA:  54 year old male with sepsis and generalized abdominal pain. Positive COVID-19. EXAM: CT ABDOMEN AND PELVIS WITH CONTRAST TECHNIQUE: Multidetector CT imaging of the abdomen and pelvis was performed using the standard protocol following bolus administration of intravenous contrast. CONTRAST:  57 OMNIPAQUE IOHEXOL 300 MG/ML  SOLN COMPARISON:  CT of the chest abdomen pelvis dated 01/28/2020. FINDINGS: Evaluation of this exam is limited due to respiratory motion artifact. Lower chest: Partially visualized small bilateral pleural effusions. Diffuse bilateral  lower lobe and lingula confluent densities most consistent with multifocal pneumonia and in keeping with COVID-19. No intra-abdominal free air. Trace free fluid in the pelvis. Hepatobiliary: No focal liver abnormality is seen. No gallstones, gallbladder wall thickening, or biliary dilatation. Pancreas: Unremarkable. No pancreatic ductal dilatation or surrounding inflammatory changes. Spleen: Normal in size without focal abnormality. Adrenals/Urinary Tract: The adrenal glands, kidneys, and the visualized ureters appear unremarkable. The urinary bladder is partially distended despite presence of a Foley catheter. Air within the bladder likely introduced via catheter. There is apparent diffuse thickening of the bladder wall which may be partly related to underdistention. Cystitis is not excluded. Correlation with urinalysis recommended. Stomach/Bowel: There is diffuse thickened and edematous appearance of the gastric wall concerning for gastritis. Clinical correlation is recommended. There is no bowel obstruction. The appendix is normal. Vascular/Lymphatic: The  abdominal aorta and IVC are unremarkable. No portal venous gas. There is no adenopathy. Reproductive: The prostate and seminal vesicles are grossly unremarkable. Other: None Musculoskeletal: No acute or significant osseous findings. IMPRESSION: 1. Findings concerning for gastritis. Clinical correlation is recommended. No bowel obstruction. Normal appendix. 2. Partially visualized small bilateral pleural effusions and multifocal pneumonia. Electronically Signed   By: Elgie Collard M.D.   On: 02/06/2020 20:02    Shirlean Mylar, MD 02/08/2020, 8:33 AM PGY-1, [facility] Family Medicine FPTS Intern pager: [phone], text pages welcome

## 2020-02-08 NOTE — Progress Notes (Signed)
CRITICAL VALUE ALERT  Critical Value: potassium = 2.6  Date & Time Notied: 02/08/2020 at 2035  Provider Notified: Night Intern - Intern Sharol Harness  Orders Received/Actions taken:

## 2020-02-08 NOTE — Progress Notes (Signed)
(  no charge--pt not seen)--please see yesterday's note.  One additional thought with respect to this patient:  Based on his age, he is a candidate for colon cancer screening.  Taking into account all factors, including his homelessness and brittle diabetes, I don't think outpt colonoscopy would be the best way to do screening for him.  Instead, if/when he follows-up in clinic, annual FIT testing would probably be more appropriate.  As an alternative, we could consider doing a colonoscopy prior to dischg, but he's certainly in no condition to tolerate a prep at this time and it's not clear if he'd be well enough to go through that while in-house; I'll leave that to your discretion--just call us back if you would like [place] to do colonoscopy on him before he leaves.  If not, I'd recommend you attempt to arrange FIT testing post dischg.  Call me if questions.  Florencia Reasons, M.D. Pager [phone] If no answer or after 5 PM call [phone]

## 2020-02-09 ENCOUNTER — Inpatient Hospital Stay: Payer: Self-pay

## 2020-02-09 LAB — COMPREHENSIVE METABOLIC PANEL
ALT: 9 U/L (ref 0–44)
AST: 15 U/L (ref 15–41)
Albumin: 1.8 g/dL — ABNORMAL LOW (ref 3.5–5.0)
Alkaline Phosphatase: 65 U/L (ref 38–126)
Anion gap: 7 (ref 5–15)
BUN: 11 mg/dL (ref 6–20)
CO2: 25 mmol/L (ref 22–32)
Calcium: 7.3 mg/dL — ABNORMAL LOW (ref 8.9–10.3)
Chloride: 112 mmol/L — ABNORMAL HIGH (ref 98–111)
Creatinine, Ser: 0.88 mg/dL (ref 0.61–1.24)
GFR calc Af Amer: >60 mL/min (ref >60)
GFR calc non Af Amer: >60 mL/min (ref >60)
Glucose, Bld: 145 mg/dL — ABNORMAL HIGH (ref 70–99)
Potassium: 3.7 mmol/L (ref 3.5–5.1)
Sodium: 144 mmol/L (ref 135–145)
Total Bilirubin: 0.7 mg/dL (ref 0.3–1.2)
Total Protein: 4.5 g/dL — ABNORMAL LOW (ref 6.5–8.1)

## 2020-02-09 LAB — BASIC METABOLIC PANEL
Anion gap: 8 (ref 5–15)
Anion gap: 7 (ref 5–15)
Anion gap: 7 (ref 5–15)
BUN: 12 mg/dL (ref 6–20)
BUN: 11 mg/dL (ref 6–20)
BUN: 11 mg/dL (ref 6–20)
CO2: 25 mmol/L (ref 22–32)
CO2: 25 mmol/L (ref 22–32)
CO2: 24 mmol/L (ref 22–32)
Calcium: 7.3 mg/dL — ABNORMAL LOW (ref 8.9–10.3)
Calcium: 7.5 mg/dL — ABNORMAL LOW (ref 8.9–10.3)
Calcium: 7.3 mg/dL — ABNORMAL LOW (ref 8.9–10.3)
Chloride: 111 mmol/L (ref 98–111)
Chloride: 113 mmol/L — ABNORMAL HIGH (ref 98–111)
Chloride: 112 mmol/L — ABNORMAL HIGH (ref 98–111)
Creatinine, Ser: 0.87 mg/dL (ref 0.61–1.24)
Creatinine, Ser: 0.86 mg/dL (ref 0.61–1.24)
Creatinine, Ser: 0.9 mg/dL (ref 0.61–1.24)
GFR calc Af Amer: >60 mL/min (ref >60)
GFR calc Af Amer: >60 mL/min (ref >60)
GFR calc Af Amer: >60 mL/min (ref >60)
GFR calc non Af Amer: >60 mL/min (ref >60)
GFR calc non Af Amer: >60 mL/min (ref >60)
GFR calc non Af Amer: >60 mL/min (ref >60)
Glucose, Bld: 85 mg/dL (ref 70–99)
Glucose, Bld: 82 mg/dL (ref 70–99)
Glucose, Bld: 156 mg/dL — ABNORMAL HIGH (ref 70–99)
Potassium: 3.1 mmol/L — ABNORMAL LOW (ref 3.5–5.1)
Potassium: 3.2 mmol/L — ABNORMAL LOW (ref 3.5–5.1)
Potassium: 4 mmol/L (ref 3.5–5.1)
Sodium: 144 mmol/L (ref 135–145)
Sodium: 145 mmol/L (ref 135–145)
Sodium: 143 mmol/L (ref 135–145)

## 2020-02-09 LAB — CBC
HCT: 34 % — ABNORMAL LOW (ref 39.0–52.0)
Hemoglobin: 11.4 g/dL — ABNORMAL LOW (ref 13.0–17.0)
MCH: 28.2 pg (ref 26.0–34.0)
MCHC: 33.5 g/dL (ref 30.0–36.0)
MCV: 84.2 fL (ref 80.0–100.0)
Platelets: 182 10*3/uL (ref 150–400)
RBC: 4.04 MIL/uL — ABNORMAL LOW (ref 4.22–5.81)
RDW: 13 % (ref 11.5–15.5)
WBC: 8.4 10*3/uL (ref 4.0–10.5)
nRBC: 0 % (ref 0.0–0.2)

## 2020-02-09 LAB — CULTURE, BLOOD (ROUTINE X 2)
Culture: NO GROWTH
Culture: NO GROWTH

## 2020-02-09 LAB — GLUCOSE, CAPILLARY
Glucose-Capillary: 104 mg/dL — ABNORMAL HIGH (ref 70–99)
Glucose-Capillary: 129 mg/dL — ABNORMAL HIGH (ref 70–99)
Glucose-Capillary: 156 mg/dL — ABNORMAL HIGH (ref 70–99)
Glucose-Capillary: 121 mg/dL — ABNORMAL HIGH (ref 70–99)
Glucose-Capillary: 70 mg/dL (ref 70–99)
Glucose-Capillary: 128 mg/dL — ABNORMAL HIGH (ref 70–99)

## 2020-02-09 LAB — PHOSPHORUS: Phosphorus: 1.6 mg/dL — ABNORMAL LOW (ref 2.5–4.6)

## 2020-02-09 LAB — D-DIMER, QUANTITATIVE: D-Dimer, Quant: 4.59 ug/mL-FEU — ABNORMAL HIGH (ref 0.00–0.50)

## 2020-02-09 MED ORDER — KCL IN DEXTROSE-NACL 20-5-0.45 MEQ/L-%-% IV SOLN
INTRAVENOUS | Status: DC
  Filled 2020-02-09 (×2): qty 1000

## 2020-02-09 MED ORDER — ENOXAPARIN SODIUM 40 MG/0.4ML ~~LOC~~ SOLN
40.0000 mg | SUBCUTANEOUS | Status: DC
  Administered 2020-02-10 – 2020-02-21 (×12): 40 mg via SUBCUTANEOUS
  Filled 2020-02-09 (×12): qty 0.4

## 2020-02-09 MED ORDER — SODIUM CHLORIDE 0.9% FLUSH
10.0000 mL | Freq: Two times a day (BID) | INTRAVENOUS | Status: DC
  Administered 2020-02-09 – 2020-02-12 (×7): 10 mL
  Administered 2020-02-13: 20 mL
  Administered 2020-02-13 – 2020-02-15 (×5): 10 mL

## 2020-02-09 MED ORDER — SODIUM CHLORIDE 0.9% FLUSH
10.0000 mL | INTRAVENOUS | Status: DC | PRN
  Administered 2020-02-10: 10 mL

## 2020-02-09 MED ORDER — TRIMETHOBENZAMIDE HCL 100 MG/ML IM SOLN
200.0000 mg | Freq: Four times a day (QID) | INTRAMUSCULAR | Status: DC | PRN
  Administered 2020-02-09 – 2020-02-17 (×6): 200 mg via INTRAMUSCULAR
  Filled 2020-02-09 (×9): qty 2

## 2020-02-09 MED ORDER — POTASSIUM CHLORIDE CRYS ER 20 MEQ PO TBCR: 40.0000 meq | EXTENDED_RELEASE_TABLET | Freq: Two times a day (BID) | ORAL | Status: DC

## 2020-02-09 MED ORDER — POTASSIUM PHOSPHATES 15 MMOLE/5ML IV SOLN
30.0000 mmol | Freq: Once | INTRAVENOUS | Status: AC
  Administered 2020-02-09: 30 mmol via INTRAVENOUS
  Filled 2020-02-09: qty 10

## 2020-02-09 MED ORDER — POTASSIUM CHLORIDE 10 MEQ/100ML IV SOLN
10.0000 meq | INTRAVENOUS | Status: AC
  Administered 2020-02-09 – 2020-02-10 (×6): 10 meq via INTRAVENOUS
  Filled 2020-02-09 (×6): qty 100

## 2020-02-09 MED ORDER — DEXTROSE IN LACTATED RINGERS 5 % IV SOLN: INTRAVENOUS | Status: DC

## 2020-02-09 MED ORDER — POTASSIUM CHLORIDE CRYS ER 20 MEQ PO TBCR
40.0000 meq | EXTENDED_RELEASE_TABLET | Freq: Once | ORAL | Status: AC
  Administered 2020-02-09: 40 meq via ORAL
  Filled 2020-02-09: qty 2

## 2020-02-09 NOTE — Progress Notes (Signed)
Physical Therapy Treatment Patient Details Name: Steven Harvey MRN: [id number] DOB: 03/26/1966 Today's Date: 02/09/2020    History of Present Illness Pt is a 54 y.o. male with recent COVID-19 dx, admitted 02/04/20 fatigue, hypothermia and gastritis. Found to have DKA. CXR with likely PNA. PMH includes DM2, HTN, HLD.    PT Comments    Pt lying supine in bed on room air with all the lights turned off on entry. RN reports pt unwilling to get up and that pt has not eaten much, however denied nausea earlier today. Pt denies nausea on entry. Lights are turned on and pt is willing to let therapist keep them on for a brief interaction. SpO2 is at 97% and vitals WNL throughout tx. Pt transfers supine<>sit with max encouragement and cuing. He demonstrates bed mobility mod I with increased time and HOB elevated. Pt sits EOB for approximately 2-3 minutes before lying back down. Pt refuses to stand, transfer to recliner, or ambulate. Unable to determine equipment needs and definitive d/c location. At this time, d/c plans remain appropriate. PT will continue to follow acutely.     Follow Up Recommendations  SNF;Supervision for mobility/OOB     Equipment Recommendations  Other (comment)(TBD; need more information)       Precautions / Restrictions Precautions Precautions: Fall;Other (comment)(airborne) Precaution Comments: nausea complaint. Pt requires max encouragement to do anything. He is not eating much and wants to be left alone at all times. Pt able to do more than he's showing.  Restrictions Weight Bearing Restrictions: No    Mobility  Bed Mobility Overal bed mobility: Modified Independent Bed Mobility: Supine to Sit;Sit to Supine     Supine to sit: Modified independent (Device/Increase time);HOB elevated Sit to supine: Modified independent (Device/Increase time);HOB elevated   General bed mobility comments: Pt requires max cuing and encouragement to participate. Pt is able to transfer EOB  mod I however increased time for participation. Pt sits EOB 2-3 minutes before refusing to do anything else and lying back down.  Transfers                 General transfer comment: Pt refused to stand on 2 attempts today. Also refuses to get into chair.   Ambulation/Gait             General Gait Details: Pt refuses to ambulate with therapist.           Balance Overall balance assessment: Needs assistance Sitting-balance support: Feet supported Sitting balance-Leahy Scale: Good Sitting balance - Comments: Pt able to sit EOB demonstrating good trunk control. Only sits EOB for short duration due to wanting to lie back down.                                    Cognition Arousal/Alertness: Lethargic Behavior During Therapy: Flat affect Overall Cognitive Status: No family/caregiver present to determine baseline cognitive functioning                                 General Comments: Difficult to assess his cognition level due to pt not wanting to be bothered, places sheets over his head, and little communication.         General Comments General comments (skin integrity, edema, etc.): Pt on room air on entry. SpO2 at 97%. Vitals WNL throughout tx. Pt denies nausea. Pt will briefly sit  EOB but unwilling to stand or do any further functional mobilty.       Pertinent Vitals/Pain Pain Assessment: No/denies pain           PT Goals (current goals can now be found in the care plan section) Acute Rehab PT Goals Patient Stated Goal: to be left alone to rest PT Goal Formulation: With patient Time For Goal Achievement: 02/19/20 Potential to Achieve Goals: Fair Progress towards PT goals: Not progressing toward goals - comment(Limited progression due to pt unwilling to participate much)    Frequency    Min 3X/week      PT Plan Current plan remains appropriate;Other (comment)(pt may be safe to go home with [facility] however need more info)        AM-PAC PT "6 Clicks" Mobility   Outcome Measure  Help needed turning from your back to your side while in a flat bed without using bedrails?: A Little Help needed moving from lying on your back to sitting on the side of a flat bed without using bedrails?: A Little Help needed moving to and from a bed to a chair (including a wheelchair)?: A Little Help needed standing up from a chair using your arms (e.g., wheelchair or bedside chair)?: A Little Help needed to walk in hospital room?: A Lot Help needed climbing 3-5 steps with a railing? : A Lot 6 Click Score: 16    End of Session   Activity Tolerance: Patient limited by lethargy Patient left: in bed;with bed alarm set;with call bell/phone within reach Nurse Communication: Mobility status;Other (comment)(willingness to briefly participate ) PT Visit Diagnosis: Other abnormalities of gait and mobility (R26.89);Muscle weakness (generalized) (M62.81)     Time: 4481-8563 PT Time Calculation (min) (ACUTE ONLY): 10 min  Charges:  $Therapeutic Activity: 8-22 mins                     Jodelle Green, PT, DPT Acute Rehabilitation Services Office [phone]   Jodelle Green 02/09/2020, 4:14 PM

## 2020-02-09 NOTE — Progress Notes (Signed)
Peripherally Inserted Central Catheter/Midline Placement  The IV Nurse has discussed with the patient and/or persons authorized to consent for the patient, the purpose of this procedure and the potential benefits and risks involved with this procedure.  The benefits include less needle sticks, lab draws from the catheter, and the patient may be discharged home with the catheter. Risks include, but not limited to, infection, bleeding, blood clot (thrombus formation), and puncture of an artery; nerve damage and irregular heartbeat and possibility to perform a PICC exchange if needed/ordered by physician.  Alternatives to this procedure were also discussed.  Bard Power PICC patient education guide, fact sheet on infection prevention and patient information card has been provided to patient /or left at bedside.    PICC Placement Documentation  PICC Double Lumen 02/09/20 PICC Left Basilic 40 cm 0 cm (Active)  Indication for Insertion or Continuance of Line Poor Vasculature-patient has had multiple peripheral attempts or PIVs lasting less than 24 hours 02/09/20 1700  Exposed Catheter (cm) 0 cm 02/09/20 1700  Site Assessment Clean;Dry;Intact 02/09/20 1700  Lumen #1 Status Flushed;Saline locked;Blood return noted 02/09/20 1700  Lumen #2 Status Flushed;Saline locked;Blood return noted 02/09/20 1700  Dressing Type Transparent;Securing device 02/09/20 1700  Dressing Status Clean;Dry;Intact;Antimicrobial disc in place 02/09/20 1700  Dressing Change Due 02/16/20 02/09/20 1700       Timmothy Sours 02/09/2020, 5:31 PM

## 2020-02-09 NOTE — Progress Notes (Signed)
Family Medicine Teaching Service Daily Progress Note Intern Pager: [phone]  Patient name: Steven Harvey Medical record number: [id number] Date of birth: 1966-09-03 Age: 54 y.o. Gender: male  Primary Care Provider: Patient, No Pcp Per Consultants: None Code Status: Full  Pt Overview and Major Events to Date:  2/27-admitted 2/28-transitioned off Endo tool  Assessment and Plan: Steven Harvey is a 54 y.o. male presenting with decreased PO intake, emesis, and hypothermia. PMH is significant for recent COVID infection, T2DM, HTN, HLD, Alcoholism, and homelessness.   Prolonged QTc Improved today. On admission patient had QTC prolongation to 504.  Yesterday QTC increased to 647 ms.  Electrolytes repleted, Mg and K yesterday. Today QTC improved to 518 ms. Patient received two doses of trazodone, started overnight on 3/2 by FNP. Discontinued today. No other QT prolonging meds on board. Expect that QTc prolongation due to electrolyte imbalances stemming from sepsis, cachexis and malnutrition at baseline, and prolonged poor PO intake due to COVID-19 with primary GI symptoms.  -Avoid QTc prolonging agents -Tigan for nausea -Continue cardiac monitoring -Replete electrolytes as needed   Hypokalemia  Hypernatremia- resolved Pt with multiple extravasated IVs due to IV K repletion (not tolerating much PO due to nausea). Plan to treat nausea today with tygan. Place midline for ease of K repletion. Today K improved to 3.7 (2.6 yesterday). Patient received KlorCon x1 (would not tolerate other PO Klor con), and KCl 42mEq x1, couldn't tolerate pain from IV site. IVF changed to LR bc some K available in that fluid. Na 144 x 2days. Today changed IVF to D5 1/2NS + KCl 20 mEq due to meet K needs daily. Free water deficit improved to 1L today. Urine osmoles >300, no concern for DI. Hopefully patient will be able to begin tolerating more PO with improved nausea, which should replete electrolytes. -Daily BMP -D5 1/2 NS  20 mEq KCl at 100 mL/hr  DKA  T2DM Transitioned off Endo tool 2/28, but remained on dextrose containing fluids due to poor PO intake, will wean down as patient able to tolerate more PO. Patient very uncomfortable, but mildly better than yesterday. SLP recommended starting a full liquid diet and advancing per patient preference. CBG in last 24 hours ranged from 150-85, patient received 8U aspart and glargine 15U yesterday. Due to poor PO intake, continuing D5 IVF. Na WNL at 144 today, K 2.9, repleted with Klcor-con 20mEqx1, refused second dose. Cl WNL at 110. -Continue lantus to 15U daily -IVF D5 1/2NS + 38mEqKCl at 100 mL/hr -Daily BMP -continuous pulse ox -FLD ADAT  COVID  SIRS  Presented w/ 4/4 SIRS criteria. Recently tested positive for COVID on 01/28/2020. No indications of bacterial infection.  He has experienced 8 days of emesis and decreased p.o. intake stating his last meal was on 01/28/20. CXR w/ patchy opacities within both lungs, indeterminate but suspicious for infection. LA 4.4> 2.3.   This is likely the result of combination of Covid, DKA, and dehydration. Procalcitonin 0.28 and blood cultures and urine culture NG x2 days, bacterial process highly unlikely. Satting well on RA. AST/ALT WNL. -Isolation precaution (Airborne and contact)  -Remdesivir day 5/5 -Daily CMP -pulse ox   hematemasis  Occurred overnight on 2/27.  Patient has continued nausea, no emesis, no BM. No abdominal pain today. Likely early complaints of abdominal pain related to mucosal tear due to many bouts of emesis over 8 days prior to admission.. H/o of NSAID use, currently on protonix. CT abdomen indicative of gastritis. D-dimer improved from 13 to  4, Hgb stable at 11.8. Lovenox started yesterday. GI reccommends no in house work up, can consider colonoscopy on discharge since unlikely to tolerate a bowel prep.  - Lovenox for DVT ppx -40 mg twice daily Protonix started -FLD, ADAT  Cachexia  malnutrition  BMI  19.73. albumin 3.2.   -Nutrition consulted, recommendations of glucerna and pro stat -FLD, ADAT   AKI- resolved Acute, improved. Patient with serum creatinine of 2.59 on admission with baseline of 1.08 from previous admissions. cr today 0.88. -Daily BMP - avoiding nephrotoxic medications  HTN Normotensive: SBP 120-133, DBP 73-81, most recently 125/81.  Endorses medication compliance with Lisinopril 5mg  daily. -Will hold lisinopril due to low normal BP  HLD Lipid panel total cholesterol 220, HDL 40, LDL 124, triglycerides 281. High intensity statin recommended due to diabetes and ASCVD risk of 14% in the next 10 years. -continuet rosuvastatin 20mg  daily   Alcoholism Endorses last alcoholic beverage was 4 months ago.  AST/ALT 15/12. CIWAs 0  Homelessness Currently living out of a motel due to isolating for Covid.  Was previously in a group home. -TOC consult   FEN/GI: NPO Prophylaxis: Lovenox  Disposition: Home pending improvement  Subjective:  Patient endorses that he feels a little better today, he still has nausea but reports it is better than yesterday. Abdomen also not as tender.  Objective: Temp:  [97.6 F (36.4 C)-98.8 F (37.1 C)] 98.8 F (37.1 C) (03/04 0857) Pulse Rate:  [82-96] 86 (03/04 0857) Resp:  [16-22] 18 (03/04 0857) BP: (120-133)/(73-81) 125/81 (03/04 0857) SpO2:  [96 %-97 %] 97 % (03/04 0857) Physical Exam: General: older, then AA man resting in bed. NAD, appears tired and uncomfortable Cardiovascular: rrr, no m/r/g Respiratory: diminished air flow in all lung fields, no crackles appreciated, no increased WOB Abdomen: Scaphoid abdomen. Mild tenderness to palpation in epigastric region (even less than yesterday) Extremities: Cachectic appearing extremities.  No edema.  Laboratory: Recent Labs  Lab 02/07/20 [phone] 02/08/20 0417 02/09/20 0619  WBC 10.7* 8.7 8.4  HGB 11.8* 11.8* 11.4*  HCT 34.7* 35.0* 34.0*  PLT 179 173 182   Recent Labs   Lab 02/07/20 0638 02/07/20 1502 02/08/20 0417 02/08/20 0417 02/08/20 1900 02/08/20 2359 02/09/20 0619  NA 151*   < > 144   < > 143 144 144  K 2.8*   < > 2.9*   < > 2.6* 3.1* 3.7  CL 121*   < > 110   < > 109 111 112*  CO2 20*   < > 22   < > 25 25 25   BUN 13   < > 13   < > 12 12 11   CREATININE 0.96   < > 0.86   < > 0.91 0.87 0.88  CALCIUM 7.5*   < > 7.2*   < > 7.3* 7.3* 7.3*  PROT 5.0*  --  5.0*  --   --   --  4.5*  BILITOT 0.9  --  0.7  --   --   --  0.7  ALKPHOS 65  --  64  --   --   --  65  ALT 10  --  11  --   --   --  9  AST 20  --  17  --   --   --  15  GLUCOSE 228*   < > 229*   < > 152* 85 145*   < > = values in this interval not displayed.   Imaging/Diagnostic  Tests: CT ABDOMEN PELVIS W CONTRAST  Result Date: 02/06/2020 CLINICAL DATA:  54 year old male with sepsis and generalized abdominal pain. Positive COVID-19. EXAM: CT ABDOMEN AND PELVIS WITH CONTRAST TECHNIQUE: Multidetector CT imaging of the abdomen and pelvis was performed using the standard protocol following bolus administration of intravenous contrast. CONTRAST:  OMNIPAQUE IOHEXOL 300 MG/ML  SOLN COMPARISON:  CT of the chest abdomen pelvis dated 01/28/2020. FINDINGS: Evaluation of this exam is limited due to respiratory motion artifact. Lower chest: Partially visualized small bilateral pleural effusions. Diffuse bilateral lower lobe and lingula confluent densities most consistent with multifocal pneumonia and in keeping with COVID-19. No intra-abdominal free air. Trace free fluid in the pelvis. Hepatobiliary: No focal liver abnormality is seen. No gallstones, gallbladder wall thickening, or biliary dilatation. Pancreas: Unremarkable. No pancreatic ductal dilatation or surrounding inflammatory changes. Spleen: Normal in size without focal abnormality. Adrenals/Urinary Tract: The adrenal glands, kidneys, and the visualized ureters appear unremarkable. The urinary bladder is partially distended despite presence of a  Foley catheter. Air within the bladder likely introduced via catheter. There is apparent diffuse thickening of the bladder wall which may be partly related to underdistention. Cystitis is not excluded. Correlation with urinalysis recommended. Stomach/Bowel: There is diffuse thickened and edematous appearance of the gastric wall concerning for gastritis. Clinical correlation is recommended. There is no bowel obstruction. The appendix is normal. Vascular/Lymphatic: The abdominal aorta and IVC are unremarkable. No portal venous gas. There is no adenopathy. Reproductive: The prostate and seminal vesicles are grossly unremarkable. Other: None Musculoskeletal: No acute or significant osseous findings. IMPRESSION: 1. Findings concerning for gastritis. Clinical correlation is recommended. No bowel obstruction. Normal appendix. 2. Partially visualized small bilateral pleural effusions and multifocal pneumonia. Electronically Signed   By: Elgie Collard M.D.   On: 02/06/2020 20:02    Shirlean Mylar, MD 02/09/2020, 9:01 AM PGY-1, [facility] Family Medicine FPTS Intern pager: [phone], text pages welcome

## 2020-02-09 NOTE — Plan of Care (Signed)
  Problem: Education: Goal: Knowledge of General Education information will improve Description: Including pain rating scale, medication(s)/side effects and non-pharmacologic comfort measures Outcome: Progressing   Problem: Health Behavior/Discharge Planning: Goal: Ability to manage health-related needs will improve Outcome: Progressing   Problem: Clinical Measurements: Goal: Ability to maintain clinical measurements within normal limits will improve Outcome: Progressing Goal: Will remain free from infection Outcome: Progressing Goal: Diagnostic test results will improve Outcome: Progressing Goal: Respiratory complications will improve Outcome: Progressing Goal: Cardiovascular complication will be avoided Outcome: Progressing   Problem: Activity: Goal: Risk for activity intolerance will decrease Outcome: Progressing   Problem: Nutrition: Goal: Adequate nutrition will be maintained Outcome: Progressing   Problem: Coping: Goal: Level of anxiety will decrease Outcome: Progressing   Problem: Elimination: Goal: Will not experience complications related to bowel motility Outcome: Progressing Goal: Will not experience complications related to urinary retention Outcome: Progressing   Problem: Pain Managment: Goal: General experience of comfort will improve Outcome: Progressing   Problem: Safety: Goal: Ability to remain free from injury will improve Outcome: Progressing   Problem: Skin Integrity: Goal: Risk for impaired skin integrity will decrease Outcome: Progressing   Problem: Education: Goal: Knowledge of risk factors and measures for prevention of condition will improve Outcome: Progressing   Problem: Coping: Goal: Psychosocial and spiritual needs will be supported Outcome: Progressing   Problem: Respiratory: Goal: Will maintain a patent airway Outcome: Progressing Goal: Complications related to the disease process, condition or treatment will be avoided or  minimized Outcome: Progressing   Problem: Education: Goal: Ability to describe self-care measures that may prevent or decrease complications (Diabetes Survival Skills Education) will improve Outcome: Progressing Goal: Individualized Educational Video(s) Outcome: Progressing   Problem: Cardiac: Goal: Ability to maintain an adequate cardiac output will improve Outcome: Progressing   Problem: Health Behavior/Discharge Planning: Goal: Ability to identify and utilize available resources and services will improve Outcome: Progressing Goal: Ability to manage health-related needs will improve Outcome: Progressing   Problem: Fluid Volume: Goal: Ability to achieve a balanced intake and output will improve Outcome: Progressing   Problem: Metabolic: Goal: Ability to maintain appropriate glucose levels will improve Outcome: Progressing   Problem: Nutritional: Goal: Maintenance of adequate nutrition will improve Outcome: Progressing Goal: Maintenance of adequate weight for body size and type will improve Outcome: Progressing   Problem: Respiratory: Goal: Will regain and/or maintain adequate ventilation Outcome: Progressing   Problem: Urinary Elimination: Goal: Ability to achieve and maintain adequate renal perfusion and functioning will improve Outcome: Progressing   

## 2020-02-10 LAB — COMPREHENSIVE METABOLIC PANEL
ALT: 10 U/L (ref 0–44)
AST: 18 U/L (ref 15–41)
Albumin: 1.7 g/dL — ABNORMAL LOW (ref 3.5–5.0)
Alkaline Phosphatase: 66 U/L (ref 38–126)
Anion gap: 8 (ref 5–15)
BUN: 9 mg/dL (ref 6–20)
CO2: 25 mmol/L (ref 22–32)
Calcium: 7.4 mg/dL — ABNORMAL LOW (ref 8.9–10.3)
Chloride: 110 mmol/L (ref 98–111)
Creatinine, Ser: 0.81 mg/dL (ref 0.61–1.24)
GFR calc Af Amer: >60 mL/min (ref >60)
GFR calc non Af Amer: >60 mL/min (ref >60)
Glucose, Bld: 134 mg/dL — ABNORMAL HIGH (ref 70–99)
Potassium: 3.9 mmol/L (ref 3.5–5.1)
Sodium: 143 mmol/L (ref 135–145)
Total Bilirubin: 0.7 mg/dL (ref 0.3–1.2)
Total Protein: 4.4 g/dL — ABNORMAL LOW (ref 6.5–8.1)

## 2020-02-10 LAB — GLUCOSE, CAPILLARY
Glucose-Capillary: 120 mg/dL — ABNORMAL HIGH (ref 70–99)
Glucose-Capillary: 121 mg/dL — ABNORMAL HIGH (ref 70–99)
Glucose-Capillary: 128 mg/dL — ABNORMAL HIGH (ref 70–99)
Glucose-Capillary: 138 mg/dL — ABNORMAL HIGH (ref 70–99)
Glucose-Capillary: 148 mg/dL — ABNORMAL HIGH (ref 70–99)
Glucose-Capillary: 155 mg/dL — ABNORMAL HIGH (ref 70–99)
Glucose-Capillary: 156 mg/dL — ABNORMAL HIGH (ref 70–99)
Glucose-Capillary: 160 mg/dL — ABNORMAL HIGH (ref 70–99)
Glucose-Capillary: 69 mg/dL — ABNORMAL LOW (ref 70–99)

## 2020-02-10 LAB — BASIC METABOLIC PANEL
Anion gap: 6 (ref 5–15)
BUN: 7 mg/dL (ref 6–20)
CO2: 23 mmol/L (ref 22–32)
Calcium: 7.2 mg/dL — ABNORMAL LOW (ref 8.9–10.3)
Chloride: 108 mmol/L (ref 98–111)
Creatinine, Ser: 0.84 mg/dL (ref 0.61–1.24)
GFR calc Af Amer: >60 mL/min (ref >60)
GFR calc non Af Amer: >60 mL/min (ref >60)
Glucose, Bld: 178 mg/dL — ABNORMAL HIGH (ref 70–99)
Potassium: 4 mmol/L (ref 3.5–5.1)
Sodium: 137 mmol/L (ref 135–145)

## 2020-02-10 LAB — CBC
HCT: 33.1 % — ABNORMAL LOW (ref 39.0–52.0)
Hemoglobin: 10.8 g/dL — ABNORMAL LOW (ref 13.0–17.0)
MCH: 28 pg (ref 26.0–34.0)
MCHC: 32.6 g/dL (ref 30.0–36.0)
MCV: 85.8 fL (ref 80.0–100.0)
Platelets: 182 10*3/uL (ref 150–400)
RBC: 3.86 MIL/uL — ABNORMAL LOW (ref 4.22–5.81)
RDW: 13 % (ref 11.5–15.5)
WBC: 7.2 10*3/uL (ref 4.0–10.5)
nRBC: 0 % (ref 0.0–0.2)

## 2020-02-10 LAB — D-DIMER, QUANTITATIVE: D-Dimer, Quant: 3.09 ug/mL-FEU — ABNORMAL HIGH (ref 0.00–0.50)

## 2020-02-10 LAB — C-REACTIVE PROTEIN: CRP: 13.6 mg/dL — ABNORMAL HIGH (ref <1.0)

## 2020-02-10 LAB — MAGNESIUM
Magnesium: 2 mg/dL (ref 1.7–2.4)
Magnesium: 1.9 mg/dL (ref 1.7–2.4)

## 2020-02-10 LAB — FERRITIN: Ferritin: 430 ng/mL — ABNORMAL HIGH (ref 24–336)

## 2020-02-10 LAB — PHOSPHORUS: Phosphorus: 2.4 mg/dL — ABNORMAL LOW (ref 2.5–4.6)

## 2020-02-10 MED ORDER — NON FORMULARY: 3.0000 mg | Freq: Every evening | Status: DC | PRN

## 2020-02-10 MED ORDER — SCOPOLAMINE 1 MG/3DAYS TD PT72
1.0000 | MEDICATED_PATCH | TRANSDERMAL | Status: DC
  Administered 2020-02-10 – 2020-02-22 (×5): 1.5 mg via TRANSDERMAL
  Filled 2020-02-10 (×7): qty 1

## 2020-02-10 MED ORDER — POTASSIUM PHOSPHATES 15 MMOLE/5ML IV SOLN
30.0000 mmol | Freq: Once | INTRAVENOUS | Status: AC
  Administered 2020-02-10: 30 mmol via INTRAVENOUS
  Filled 2020-02-10: qty 10

## 2020-02-10 MED ORDER — DEXTROSE-NACL 5-0.45 % IV SOLN: INTRAVENOUS | Status: DC

## 2020-02-10 MED ORDER — MELATONIN 3 MG PO TABS
3.0000 mg | ORAL_TABLET | Freq: Every evening | ORAL | Status: DC | PRN
  Administered 2020-02-10: 3 mg via ORAL
  Filled 2020-02-10 (×3): qty 1

## 2020-02-10 NOTE — Progress Notes (Signed)
Family Medicine Teaching Service Daily Progress Note Intern Pager: [phone]  Patient name: Steven Harvey Medical record number: [id number] Date of birth: 11-Apr-1966 Age: 54 y.o. Gender: male  Primary Care Provider: Patient, No Pcp Per Consultants: None Code Status: Full  Pt Overview and Major Events to Date:  2/27-admitted 2/28-transitioned off Endo tool  Assessment and Plan: Steven Harvey is a 54 y.o. male presenting with decreased PO intake, emesis, and hypothermia. PMH is significant for recent COVID infection, T2DM, HTN, HLD, Alcoholism, and homelessness.   Cachexia  malnutrition  BMI 19.73. albumin 3.2. No PO for 8 days prior to hospitalization, no PO in hospital so far (6 days). Discussed importance of trying to tolerate PO now that tygan onboard and no emesis since admission. If patient cannot tolerate PO today, will have to consult nutrition and place NGT for enteral feeds. Checked back with Steven Harvey later today, and he has drank several times and kept it down. Will continue to monitor. -Nutrition consulted, recommendations of glucerna and pro stat, will consider NGT if needed -FLD, ADAT   Prolonged QTc Improved today to 518. On admission patient had QTC prolongation to 504. On 3/3 QTC increased to 647 ms.  Electrolytes repleted: Mg and K WNL at 2 and 3.7. Today QTC improved to 518 ms. Patient received two doses of trazodone, started overnight on 3/2 by FNP. Discontinued today. No other QT prolonging meds on board. Expect that QTc prolongation due to electrolyte imbalances stemming from sepsis, cachexis and malnutrition at baseline, and prolonged poor PO intake due to COVID-19 with primary GI symptoms.  -Avoid QTc prolonging agents -Tigan for nausea -Continue cardiac monitoring -Replete electrolytes as needed   Hypokalemia  Hypernatremia- resolved PICC line in place, due to multiple extravasated lines. Continue treating nausea with tygan. Today K improved to 3.7 (2.6  yesterday). Na 144 x 2days. Today changed IVF to D5 1/2NS + K phos (phos 2.6).  Urine osmoles >300, no concern for DI. Hopefully patient will be able to begin tolerating more PO with improved nausea, which should replete electrolytes. -Daily BMP -D5 1/2 NS at 100 mL/hr + 80 Kphos  DKA  T2DM Transitioned off Endo tool 2/28, but remained on dextrose containing fluids due to poor PO intake, will wean down as patient able to tolerate more PO. Patient feels better today. SLP recommended starting a full liquid diet and advancing per patient preference. CBG in last 24 hours ranged from 120-160, patient received 8U aspart and glargine 15U yesterday. Due to poor PO intake, continuing D5 IVF. -Continue lantus to 15U daily -IVF D5 1/2NS  -Daily BMP  -FLD ADAT  COVID  SIRS  Presented w/ 4/4 SIRS criteria. Recently tested positive for COVID on 01/28/2020. No indications of bacterial infection.  He has experienced 8 days of emesis and decreased p.o. intake stating his last meal was on 01/28/20. CXR w/ patchy opacities within both lungs, indeterminate but suspicious for infection. LA 4.4> 2.3.   This is likely the result of combination of Covid, DKA, and dehydration. Procalcitonin 0.28 and blood cultures and urine culture NG x2 days, bacterial process highly unlikely. Satting well on RA. AST/ALT WNL. -Isolation precaution (Airborne and contact)  -Remdesivir day 5/5 -Daily CMP -pulse ox   hematemasis- improved Occurred overnight on 2/27. GI reccommends no in house work up, can consider colonoscopy on discharge since unlikely to tolerate a bowel prep.  - Lovenox for DVT ppx -40 mg twice daily Protonix started -FLD, ADAT  AKI- resolved Acute, improved. Patient  with serum creatinine of 2.59 on admission with baseline of 1.08 from previous admissions. cr today 0.81. -Daily BMP  HTN Normotensive: SBP 122-137, DBP 70-92, most recently 135/92.  Endorses medication compliance with Lisinopril 5mg   daily. -Will hold lisinopril due to low normal BP  HLD Lipid panel total cholesterol 220, HDL 40, LDL 124, triglycerides 281. High intensity statin recommended due to diabetes and ASCVD risk of 14% in the next 10 years. -continue rosuvastatin 20mg  daily   Alcoholism Endorses last alcoholic beverage was 4 months ago.  AST/ALT 15/12. CIWAs 0  Homelessness Currently living out of a motel due to isolating for Covid.  Was previously in a group home. -TOC consult   FEN/GI: NPO Prophylaxis: Lovenox  Disposition: Home pending improvement  Subjective:  Patient endorses that he feels a little better today, drinking some fluids and tolerating it!  Objective: Temp:  [98.6 F (37 C)-99.5 F (37.5 C)] 98.9 F (37.2 C) (03/05 0746) Pulse Rate:  [83-93] 93 (03/05 0746) Resp:  [15-20] 20 (03/05 0746) BP: (122-137)/(70-92) 135/92 (03/05 0746) SpO2:  [95 %-100 %] 97 % (03/05 0746) Physical Exam: General: older, then AA man resting in bed, NAD Cardiovascular: rrr, no m/r/g Respiratory: CTAB, no increased WOB, no rales or rhonchi Abdomen: Scaphoid abdomen. Soft, NT, ND, normal bowel sounds present Extremities: Cachectic appearing extremities.  No edema.  Laboratory: Recent Labs  Lab 02/08/20 0417 02/09/20 0619 02/10/20 0315  WBC 8.7 8.4 7.2  HGB 11.8* 11.4* 10.8*  HCT 35.0* 34.0* 33.1*  PLT 173 182 182   Recent Labs  Lab 02/08/20 0417 02/08/20 1900 02/09/20 0619 02/09/20 0619 02/09/20 1429 02/09/20 2121 02/10/20 0315  NA 144   < > 144   < > 145 143 143  K 2.9*   < > 3.7   < > 3.2* 4.0 3.9  CL 110   < > 112*   < > 113* 112* 110  CO2 22   < > 25   < > 25 24 25   BUN 13   < > 11   < > 11 11 9   CREATININE 0.86   < > 0.88   < > 0.86 0.90 0.81  CALCIUM 7.2*   < > 7.3*   < > 7.5* 7.3* 7.4*  PROT 5.0*  --  4.5*  --   --   --  4.4*  BILITOT 0.7  --  0.7  --   --   --  0.7  ALKPHOS 64  --  65  --   --   --  66  ALT 11  --  9  --   --   --  10  AST 17  --  15  --   --   --  18   GLUCOSE 229*   < > 145*   < > 82 156* 134*   < > = values in this interval not displayed.   Imaging/Diagnostic Tests: CT ABDOMEN PELVIS W CONTRAST  Result Date: 02/06/2020 CLINICAL DATA:  54 year old male with sepsis and generalized abdominal pain. Positive COVID-19. EXAM: CT ABDOMEN AND PELVIS WITH CONTRAST TECHNIQUE: Multidetector CT imaging of the abdomen and pelvis was performed using the standard protocol following bolus administration of intravenous contrast. CONTRAST:  OMNIPAQUE IOHEXOL 300 MG/ML  SOLN COMPARISON:  CT of the chest abdomen pelvis dated 01/28/2020. FINDINGS: Evaluation of this exam is limited due to respiratory motion artifact. Lower chest: Partially visualized small bilateral pleural effusions. Diffuse bilateral lower lobe and lingula  confluent densities most consistent with multifocal pneumonia and in keeping with COVID-19. No intra-abdominal free air. Trace free fluid in the pelvis. Hepatobiliary: No focal liver abnormality is seen. No gallstones, gallbladder wall thickening, or biliary dilatation. Pancreas: Unremarkable. No pancreatic ductal dilatation or surrounding inflammatory changes. Spleen: Normal in size without focal abnormality. Adrenals/Urinary Tract: The adrenal glands, kidneys, and the visualized ureters appear unremarkable. The urinary bladder is partially distended despite presence of a Foley catheter. Air within the bladder likely introduced via catheter. There is apparent diffuse thickening of the bladder wall which may be partly related to underdistention. Cystitis is not excluded. Correlation with urinalysis recommended. Stomach/Bowel: There is diffuse thickened and edematous appearance of the gastric wall concerning for gastritis. Clinical correlation is recommended. There is no bowel obstruction. The appendix is normal. Vascular/Lymphatic: The abdominal aorta and IVC are unremarkable. No portal venous gas. There is no adenopathy. Reproductive: The prostate  and seminal vesicles are grossly unremarkable. Other: None Musculoskeletal: No acute or significant osseous findings. IMPRESSION: 1. Findings concerning for gastritis. Clinical correlation is recommended. No bowel obstruction. Normal appendix. 2. Partially visualized small bilateral pleural effusions and multifocal pneumonia. Electronically Signed   By: Elgie Collard M.D.   On: 02/06/2020 20:02   [place] EKG SITE RITE  Result Date: 02/09/2020 If Site Rite image not attached, placement could not be confirmed due to current cardiac rhythm.   Shirlean Mylar, MD 02/10/2020, 9:34 AM PGY-1, [facility] Family Medicine FPTS Intern pager: [phone], text pages welcome

## 2020-02-10 NOTE — Plan of Care (Signed)
  Problem: Education: Goal: Knowledge of General Education information will improve Description: Including pain rating scale, medication(s)/side effects and non-pharmacologic comfort measures Outcome: Progressing   Problem: Health Behavior/Discharge Planning: Goal: Ability to manage health-related needs will improve Outcome: Progressing   Problem: Clinical Measurements: Goal: Ability to maintain clinical measurements within normal limits will improve Outcome: Progressing Goal: Will remain free from infection Outcome: Progressing Goal: Diagnostic test results will improve Outcome: Progressing Goal: Respiratory complications will improve Outcome: Progressing Goal: Cardiovascular complication will be avoided Outcome: Progressing   Problem: Activity: Goal: Risk for activity intolerance will decrease Outcome: Progressing   Problem: Nutrition: Goal: Adequate nutrition will be maintained Outcome: Progressing   Problem: Coping: Goal: Level of anxiety will decrease Outcome: Progressing   Problem: Elimination: Goal: Will not experience complications related to bowel motility Outcome: Progressing Goal: Will not experience complications related to urinary retention Outcome: Progressing   Problem: Pain Managment: Goal: General experience of comfort will improve Outcome: Progressing   Problem: Safety: Goal: Ability to remain free from injury will improve Outcome: Progressing   Problem: Skin Integrity: Goal: Risk for impaired skin integrity will decrease Outcome: Progressing   Problem: Education: Goal: Knowledge of risk factors and measures for prevention of condition will improve Outcome: Progressing   Problem: Coping: Goal: Psychosocial and spiritual needs will be supported Outcome: Progressing   Problem: Respiratory: Goal: Will maintain a patent airway Outcome: Progressing Goal: Complications related to the disease process, condition or treatment will be avoided or  minimized Outcome: Progressing   Problem: Education: Goal: Ability to describe self-care measures that may prevent or decrease complications (Diabetes Survival Skills Education) will improve Outcome: Progressing Goal: Individualized Educational Video(s) Outcome: Progressing   Problem: Cardiac: Goal: Ability to maintain an adequate cardiac output will improve Outcome: Progressing   Problem: Health Behavior/Discharge Planning: Goal: Ability to identify and utilize available resources and services will improve Outcome: Progressing Goal: Ability to manage health-related needs will improve Outcome: Progressing   Problem: Fluid Volume: Goal: Ability to achieve a balanced intake and output will improve Outcome: Progressing   Problem: Metabolic: Goal: Ability to maintain appropriate glucose levels will improve Outcome: Progressing   Problem: Nutritional: Goal: Maintenance of adequate nutrition will improve Outcome: Progressing Goal: Maintenance of adequate weight for body size and type will improve Outcome: Progressing   Problem: Respiratory: Goal: Will regain and/or maintain adequate ventilation Outcome: Progressing   Problem: Urinary Elimination: Goal: Ability to achieve and maintain adequate renal perfusion and functioning will improve Outcome: Progressing

## 2020-02-10 NOTE — Progress Notes (Signed)
Occupational Therapy Treatment Patient Details Name: Steven Harvey MRN: [id number] DOB: Sep 13, 1966 Today's Date: 02/10/2020    History of present illness Pt is a 54 y.o. male with recent COVID-43 dx, admitted 02/04/20 fatigue, hypothermia and gastritis. Found to have DKA. CXR with likely PNA. PMH includes DM2, HTN, HLD.   OT comments  Pt continues to require max encouragement to participate in therapy sessions. Pt Modified Independent for bed mobility sit <> supine and sat EOB for 5-10 min. Pt Supervision for sit to stand without AD at bedside, but refuses to sit up in chair despite education on importance. Pt Supervision for side steps up to [facility], one LOB - Min A/min guard to correct. Pt setup for drinking from cup, continues to report nausea. Suspect LOB and weakness from lack of OOB activity. Continue to recommend SNF unless pt demo improved OOB functional abilities.    Follow Up Recommendations  SNF;Supervision/Assistance - 24 hour    Equipment Recommendations  Other (comment)    Recommendations for Other Services      Precautions / Restrictions Precautions Precautions: Fall;Other (comment) Precaution Comments: Pt tends to feel nauseous. Needs max encouragement for participation  Restrictions Weight Bearing Restrictions: No       Mobility Bed Mobility Overal bed mobility: Modified Independent Bed Mobility: Supine to Sit;Sit to Supine     Supine to sit: Modified independent (Device/Increase time);HOB elevated Sit to supine: Modified independent (Device/Increase time);HOB elevated   General bed mobility comments: Pt requires max cuing and encouragement to participate. Pt is able to transfer EOB mod I however increased time for participation. Pt sits EOB approximately 5-10 minutes demonstrated good trunk control.   Transfers Overall transfer level: Needs assistance Equipment used: None Transfers: Sit to/from Stand Sit to Stand: Supervision         General transfer  comment: Pt Superivision for sit to stand without AD, able to side step up to [place] Overall balance assessment: Needs assistance Sitting-balance support: Feet supported Sitting balance-Leahy Scale: Good     Standing balance support: No upper extremity supported Standing balance-Leahy Scale: Fair Standing balance comment: Pt is briefly able to stand and maintain his balance statically however becomes dizzy and needs to sit back down as to not lose his balance                           ADL either performed or assessed with clinical judgement   ADL Overall ADL's : Needs assistance/impaired Eating/Feeding: Set up;Sitting                                     General ADL Comments: Pt reports transferring to [facility] without assistance     Vision       Perception     Praxis      Cognition Arousal/Alertness: Lethargic Behavior During Therapy: Flat affect Overall Cognitive Status: No family/caregiver present to determine baseline cognitive functioning                                 General Comments: Pt is OxA x 4. Pt is self limiting due to decreased motivation and not feeling well.        Exercises     Shoulder Instructions       General Comments Pt is on room  air. RN is working with pt on entry for IV treatment. SpO2 is at 94-95% throughout tx. Vitals WNL. Pt requires max encouragement and wishes to lie back down at end of therapy. Refuses to sit upright today.    Pertinent Vitals/ Pain       Pain Assessment: No/denies pain  Home Living                                          Prior Functioning/Environment              Frequency  Min 2X/week        Progress Toward Goals  OT Goals(current goals can now be found in the care plan section)  Progress towards OT goals: Progressing toward goals  Acute Rehab OT Goals Patient Stated Goal: to go back to sleep OT Goal Formulation: With  patient Time For Goal Achievement: 02/19/20 Potential to Achieve Goals: Good ADL Goals Pt Will Perform Grooming: with modified independence;sitting Pt Will Perform Lower Body Bathing: with supervision;sitting/lateral leans Pt Will Perform Upper Body Dressing: with modified independence;sitting Pt Will Perform Lower Body Dressing: with supervision;sitting/lateral leans Additional ADL Goal #1: Pt will participate in OOB functional mobility assessment.  Plan Discharge plan remains appropriate    Co-evaluation      Reason for Co-Treatment: To address functional/ADL transfers;Necessary to address cognition/behavior during functional activity PT goals addressed during session: Mobility/safety with mobility;Balance OT goals addressed during session: ADL's and self-care      AM-PAC OT "6 Clicks" Daily Activity     Outcome Measure   Help from another person eating meals?: A Little Help from another person taking care of personal grooming?: A Little Help from another person toileting, which includes using toliet, bedpan, or urinal?: A Little Help from another person bathing (including washing, rinsing, drying)?: A Lot Help from another person to put on and taking off regular upper body clothing?: A Little Help from another person to put on and taking off regular lower body clothing?: A Lot 6 Click Score: 16    End of Session Equipment Utilized During Treatment: Other (comment)(none)  OT Visit Diagnosis: Muscle weakness (generalized) (M62.81);Other abnormalities of gait and mobility (R26.89)   Activity Tolerance Other (comment)(Limited by decreased motivation and reports of nausea)   Patient Left in bed;with call bell/phone within reach;with bed alarm set   Nurse Communication Mobility status        Time: 9937-1696 OT Time Calculation (min): 23 min  Charges: OT General Charges $OT Visit: 1 Visit OT Treatments $Therapeutic Activity: 8-22 mins  Lorre Munroe, OTR/L   Lorre Munroe 02/10/2020, 2:24 PM

## 2020-02-10 NOTE — TOC Progression Note (Signed)
Transition of Care [facility]) - Progression Note    Patient Details  Name: Steven Harvey MRN: [id number] Date of Birth: 06-Mar-1966  [facility] CM/SW Contact  Mearl Latin, LCSW Phone Number: 02/10/2020, 4:53 PM  Clinical Narrative:    CSW notes patient is refusing to work with therapies and requires maximum encouragement to participate. At this point he is still unsafe to go to a hotel, but does not have a payer source for SNF. CSW to continue to follow.         Expected Discharge Plan and Services                                                 Social Determinants of Health (SDOH) Interventions    Readmission Risk Interventions No flowsheet data found.

## 2020-02-10 NOTE — Progress Notes (Signed)
Physical Therapy Treatment Patient Details Name: Steven Harvey MRN: [id number] DOB: 14-Dec-1965 Today's Date: 02/10/2020    History of Present Illness Pt is a 54 y.o. male with recent COVID-19 dx, admitted 02/04/20 fatigue, hypothermia and gastritis. Found to have DKA. CXR with likely PNA. PMH includes DM2, HTN, HLD.    PT Comments    Pt resting on room air on entry. RN administering IV treatment. Pt demonstrates limited willingness to participate with PT and OT co-treat. Pt transfers supine<>sit mod I with HOB elevated and increased time for encouragement. Pt stands with supervision at EOB and takes a few steps to FOB with supervision. Pt demonstrates one LOB and requires min A to recover. He reports feeling dizzy and abruptly lies back down. Vitals WNL. Pt likely experiencing generalized weakness and endurance limitations due to hardly being up OOB all week. D/C location remaining at SNF unless pt determined safe and appropriate to return to home with further participation/ progress. PT will continue to follow acutely.    Follow Up Recommendations  SNF;Supervision for mobility/OOB     Equipment Recommendations  Other (comment)(TBD)       Precautions / Restrictions Precautions Precautions: Fall;Other (comment)(airborne) Precaution Comments: Pt tends to feel nauseous. Needs max encouragement for participation  Restrictions Weight Bearing Restrictions: No    Mobility  Bed Mobility Overal bed mobility: Modified Independent Bed Mobility: Supine to Sit;Sit to Supine     Supine to sit: Modified independent (Device/Increase time);HOB elevated Sit to supine: Modified independent (Device/Increase time);HOB elevated   General bed mobility comments: Pt requires max cuing and encouragement to participate. Pt is able to transfer EOB mod I however increased time for participation. Pt sits EOB approximately 5-10 minutes demonstrated good trunk control.   Transfers Overall transfer level: Needs  assistance Equipment used: None Transfers: Sit to/from Stand Sit to Stand: Supervision         General transfer comment: Pt requires max encouragement to stand and abruptly stands up when he is ready. Pt has decreased safety awareness with functional mobility.  Ambulation/Gait Ambulation/Gait assistance: Supervision;Min assist Gait Distance (Feet): 3 Feet Assistive device: None Gait Pattern/deviations: Decreased step length - right;Decreased step length - left;Decreased stride length;Shuffle;Step-to pattern Gait velocity: decreased Gait velocity interpretation: <1.8 ft/sec, indicate of risk for recurrent falls General Gait Details: Pt ambulates a few feet to FOB. Pt requires supervision for ambulation however on one instance demonstrates LOB and requires min A to recover and regain steadiness.          Balance Overall balance assessment: Needs assistance Sitting-balance support: Feet supported Sitting balance-Leahy Scale: Good     Standing balance support: No upper extremity supported Standing balance-Leahy Scale: Fair Standing balance comment: Pt is briefly able to stand and maintain his balance statically however becomes dizzy and needs to sit back down as to not lose his balance                            Cognition Arousal/Alertness: Lethargic Behavior During Therapy: Flat affect Overall Cognitive Status: No family/caregiver present to determine baseline cognitive functioning                                 General Comments: Pt is OxA x 4. Pt is self limiting due to decreased motivation and not feeling well.         General Comments General comments (skin integrity,  edema, etc.): Pt is on room air. RN is working with pt on entry for IV treatment. SpO2 is at 94-95% throughout tx. Vitals WNL. Pt requires max encouragement and wishes to lie back down at end of therapy. Refuses to sit upright today.      Pertinent Vitals/Pain Pain Assessment:  No/denies pain           PT Goals (current goals can now be found in the care plan section) Acute Rehab PT Goals Patient Stated Goal: to go back to sleep PT Goal Formulation: With patient Time For Goal Achievement: 02/19/20 Potential to Achieve Goals: Fair Progress towards PT goals: Not progressing toward goals - comment(limited progression due to limited pt participation )    Frequency    Min 3X/week      PT Plan Current plan remains appropriate;Other (comment)(continue to assess pt to determine if [facility] would be more appro)    Co-evaluation   Reason for Co-Treatment: To address functional/ADL transfers;Necessary to address cognition/behavior during functional activity PT goals addressed during session: Mobility/safety with mobility;Balance        AM-PAC PT "6 Clicks" Mobility   Outcome Measure  Help needed turning from your back to your side while in a flat bed without using bedrails?: None Help needed moving from lying on your back to sitting on the side of a flat bed without using bedrails?: None Help needed moving to and from a bed to a chair (including a wheelchair)?: A Little Help needed standing up from a chair using your arms (e.g., wheelchair or bedside chair)?: None Help needed to walk in hospital room?: A Little Help needed climbing 3-5 steps with a railing? : A Lot 6 Click Score: 20    End of Session Equipment Utilized During Treatment: Gait belt Activity Tolerance: Patient limited by lethargy Patient left: in bed;with call bell/phone within reach;with bed alarm set Nurse Communication: Mobility status;Other (comment)(limited willingness to participate, and dizziness cc standin) PT Visit Diagnosis: Other abnormalities of gait and mobility (R26.89);Muscle weakness (generalized) (M62.81)     Time: 5462-7035 PT Time Calculation (min) (ACUTE ONLY): 23 min  Charges:  $Therapeutic Activity: 8-22 mins                     Jodelle Green, PT, DPT Acute  Rehabilitation Services Office [phone]   Jodelle Green 02/10/2020, 1:08 PM

## 2020-02-11 LAB — CBC
HCT: 33.7 % — ABNORMAL LOW (ref 39.0–52.0)
Hemoglobin: 10.7 g/dL — ABNORMAL LOW (ref 13.0–17.0)
MCH: 27.3 pg (ref 26.0–34.0)
MCHC: 31.8 g/dL (ref 30.0–36.0)
MCV: 86 fL (ref 80.0–100.0)
Platelets: 196 10*3/uL (ref 150–400)
RBC: 3.92 MIL/uL — ABNORMAL LOW (ref 4.22–5.81)
RDW: 12.8 % (ref 11.5–15.5)
WBC: 7 10*3/uL (ref 4.0–10.5)
nRBC: 0.3 % — ABNORMAL HIGH (ref 0.0–0.2)

## 2020-02-11 LAB — COMPREHENSIVE METABOLIC PANEL
ALT: 11 U/L (ref 0–44)
AST: 21 U/L (ref 15–41)
Albumin: 1.6 g/dL — ABNORMAL LOW (ref 3.5–5.0)
Alkaline Phosphatase: 69 U/L (ref 38–126)
Anion gap: 9 (ref 5–15)
BUN: 6 mg/dL (ref 6–20)
CO2: 25 mmol/L (ref 22–32)
Calcium: 7.3 mg/dL — ABNORMAL LOW (ref 8.9–10.3)
Chloride: 105 mmol/L (ref 98–111)
Creatinine, Ser: 1.02 mg/dL (ref 0.61–1.24)
GFR calc Af Amer: >60 mL/min (ref >60)
GFR calc non Af Amer: >60 mL/min (ref >60)
Glucose, Bld: 172 mg/dL — ABNORMAL HIGH (ref 70–99)
Potassium: 3.5 mmol/L (ref 3.5–5.1)
Sodium: 139 mmol/L (ref 135–145)
Total Bilirubin: 0.6 mg/dL (ref 0.3–1.2)
Total Protein: 4.3 g/dL — ABNORMAL LOW (ref 6.5–8.1)

## 2020-02-11 LAB — GLUCOSE, CAPILLARY
Glucose-Capillary: 113 mg/dL — ABNORMAL HIGH (ref 70–99)
Glucose-Capillary: 122 mg/dL — ABNORMAL HIGH (ref 70–99)
Glucose-Capillary: 140 mg/dL — ABNORMAL HIGH (ref 70–99)
Glucose-Capillary: 149 mg/dL — ABNORMAL HIGH (ref 70–99)
Glucose-Capillary: 157 mg/dL — ABNORMAL HIGH (ref 70–99)
Glucose-Capillary: 97 mg/dL (ref 70–99)

## 2020-02-11 LAB — FERRITIN: Ferritin: 376 ng/mL — ABNORMAL HIGH (ref 24–336)

## 2020-02-11 LAB — C-REACTIVE PROTEIN: CRP: 13.9 mg/dL — ABNORMAL HIGH (ref <1.0)

## 2020-02-11 LAB — D-DIMER, QUANTITATIVE: D-Dimer, Quant: 2.25 ug/mL-FEU — ABNORMAL HIGH (ref 0.00–0.50)

## 2020-02-11 MED ORDER — PANTOPRAZOLE SODIUM 40 MG PO TBEC
40.0000 mg | DELAYED_RELEASE_TABLET | Freq: Two times a day (BID) | ORAL | Status: DC
  Administered 2020-02-11 – 2020-02-22 (×22): 40 mg via ORAL
  Filled 2020-02-11 (×22): qty 1

## 2020-02-11 MED ORDER — ACETAMINOPHEN 325 MG PO TABS
650.0000 mg | ORAL_TABLET | Freq: Four times a day (QID) | ORAL | Status: DC | PRN
  Administered 2020-02-11 – 2020-02-17 (×4): 650 mg via ORAL
  Filled 2020-02-11 (×4): qty 2

## 2020-02-11 MED ORDER — ENSURE ENLIVE PO LIQD
237.0000 mL | Freq: Four times a day (QID) | ORAL | Status: DC
  Administered 2020-02-11 – 2020-02-16 (×6): 237 mL via ORAL

## 2020-02-11 NOTE — Progress Notes (Signed)
Family Medicine Teaching Service Daily Progress Note Intern Pager: [phone]  Patient name: Steven Harvey Medical record number: [id number] Date of birth: September 27, 1966 Age: 54 y.o. Gender: male  Primary Care Provider: Patient, No Pcp Per Consultants: None Code Status: Full  Pt Overview and Major Events to Date:  2/27-admitted 2/28-transitioned off Endo tool  Assessment and Plan: Steven Harvey is a 54 y.o. male presenting with decreased PO intake, emesis, and hypothermia and COVID+. PMH is significant for T2DM, HTN, HLD, Alcoholism, and homelessness.   Intractable nausea suspected related to COVID infection, DKA- respiratory status stable ORA. Patient endorses improved nausea. Unsure if this is due to natural progression of disease or one of our interventions but he has some PO toleration yesterday. Ate 15% of lunch without emesis as well as multiple cups of fluids.  - encouraged patient to continue to increase PO intake as tolerated - consider enteral feeds if needed - continue scopolamine patch and tigan - consult to dietitian for nutrition recommendations  - continue multivitamin and nutritional shakes - GI recommends OP workup including colon cancer screening  Prolonged QTc- 525 yesterday and has been stable -Avoid QTc prolonging agents -Continue cardiac monitoring -Replete electrolytes as needed   Hypokalemia  Hypernatremia- resolved  K+ 3.5, Na+ 139 today. Will hopefully stay stable with patient increasing PO intake. Continue IV fluids until PO intake is adequate but can reduce rate since tolerating more fluids PO and give [place] a better idea of what his natural state will be. (+500cc yesterday) -Daily BMP -D5 1/2 NS at 75 mL/hr  T2DM-stable, DKA resolved Blood sugars 128-172 overnight. On 15u lantus and SSI. Home med is metformin but would still hold off as to not exacerbate any more GI symptoms.  -Continue lantus to 15U daily -Daily BMP  AKI- resolved Creatinine 1.02  today -Daily BMP  HTN- chronic, stable. 116/78 at latest - consider restarting lisinopril home med for ppx benefits.   HLD- chronic -continue rosuvastatin 20mg  daily   EtOH abuse/homelessness  CIWA scores were trended and negative. CSW consulted and provided with resources.  - f/u CSW for OP placement   FEN/GI: full liquid Prophylaxis: Lovenox  Disposition: PT/OT recommending SNF. CSW seeking placement options  Subjective:  Patient endorses that he feels a little better today, drinking some fluids and tolerating it without any emesis. Wanting to try to eat more foods today. Is wanting to go home when able  Objective: Temp:  [98.4 F (36.9 C)-99.9 F (37.7 C)] 98.4 F (36.9 C) (03/06 0928) Pulse Rate:  [94-98] 98 (03/06 0844) Resp:  [12-24] 12 (03/06 0844) BP: (116-124)/(72-82) 116/78 (03/06 0844) SpO2:  [95 %-97 %] 96 % (03/06 0844) Physical Exam: General: NAD, laying in bed Cardiovascular: rrr, no m/r/g Respiratory: CTAB, no increased WOB, no rales or rhonchi Abdomen: Scaphoid abdomen. Soft, NT, ND, normal bowel sounds present Extremities: Cachectic appearing extremities.  No edema.  Laboratory: Recent Labs  Lab 02/09/20 0619 02/10/20 0315 02/11/20 0345  WBC 8.4 7.2 7.0  HGB 11.4* 10.8* 10.7*  HCT 34.0* 33.1* 33.7*  PLT 182 182 196   Recent Labs  Lab 02/09/20 0619 02/09/20 1429 02/10/20 0315 02/10/20 1800 02/11/20 0345  NA 144   < > 143 137 139  K 3.7   < > 3.9 4.0 3.5  CL 112*   < > 110 108 105  CO2 25   < > 25 23 25   BUN 11   < > 9 7 6   CREATININE 0.88   < >  0.81 0.84 1.02  CALCIUM 7.3*   < > 7.4* 7.2* 7.3*  PROT 4.5*  --  4.4*  --  4.3*  BILITOT 0.7  --  0.7  --  0.6  ALKPHOS 65  --  66  --  69  ALT 9  --  10  --  11  AST 15  --  18  --  21  GLUCOSE 145*   < > 134* 178* 172*   < > = values in this interval not displayed.   Imaging/Diagnostic Tests: [place] EKG SITE RITE  Result Date: 02/09/2020 If Site Rite image not attached, placement could  not be confirmed due to current cardiac rhythm.   Richarda Osmond, DO 02/11/2020, 10:40 AM PGY-2, [place] Intern pager: [phone], text pages welcome

## 2020-02-11 NOTE — Progress Notes (Signed)
Nutrition Follow-up   RD working remotely.  DOCUMENTATION CODES:   Not applicable  INTERVENTION:  Provide Ensure Enlive po QID, each supplement provides 350 kcal and 20 grams of protein.  Provide 30 ml Prostat po TID, each supplement provides 100 kcal and 15 grams of protein.   If po intake does not improve within 24-48 hours, recommend initiation of enteral nutrition using Osmolite 1.5 formula at 20 ml/hr and increase by 10 ml every 4 hours to goal rate of 50 ml/hr to provide 1800 kcal, 75 grams of protein, and 912 ml water.   Encourage PO intake.   NUTRITION DIAGNOSIS:   Increased nutrient needs related to acute illness as evidenced by estimated needs; ongoing  GOAL:   Patient will meet greater than or equal to 90% of their needs; progressing  MONITOR:   PO intake, Weight trends, Supplement acceptance, Skin, Diet advancement, Labs, I & O's  REASON FOR ASSESSMENT:   Consult Assessment of nutrition requirement/status  ASSESSMENT:   54 y.o. male presenting with decreased PO intake, emesis, and hypothermia. PMH is significant for recent COVID infection, T2DM, HTN, HLD, Alcoholism, and homelessness. Recently diagnosed with covid, which likely triggered his DKA.  Pt with improved nausea. Pt tolerating diet with no emesis. Meal completion poor at 15%. Pt currently has Glucerna shake and Prostat ordered with varied consumption. Per MD, may consider enteral feeds if needed. Pt however motivated to eat more at his meal trays today. If po intake does not improve within the next 24-48 hours, recommend initiation of enteral nutrition. Nursing staff to place NGT feeding tube at bedside. No Cortrak service on the weekends. RD to additionally modify nutritional supplement orders. Will order Ensure instead to aid in caloric and protein needs.   Labs and medications reviewed.   Diet Order:   Diet Order            Diet full liquid Room service appropriate? Yes; Fluid consistency: Thin   Diet effective now              EDUCATION NEEDS:   Not appropriate for education at this time  Skin:  Skin Assessment: Reviewed RN Assessment  Last BM:  3/3  Height:   Ht Readings from Last 1 Encounters:  02/04/20 5\' 7"  (1.702 m)    Weight:   Wt Readings from Last 1 Encounters:  02/04/20 57.2 kg    Ideal Body Weight:  67.27 kg  BMI:  Body mass index is 19.73 kg/m.  Estimated Nutritional Needs:   Kcal:  1800-2000  Protein:  85-100 grams  Fluid:  >/= 1.8 L/day   02/06/20, MS, RD, LDN RD pager number/after hours weekend pager number on Amion.

## 2020-02-11 NOTE — Progress Notes (Signed)
Patient foley was discontinue as order, by MD.Neal

## 2020-02-12 DIAGNOSIS — R110 Nausea: Secondary | ICD-10-CM

## 2020-02-12 LAB — BASIC METABOLIC PANEL
Anion gap: 6 (ref 5–15)
BUN: <5 mg/dL — ABNORMAL LOW (ref 6–20)
CO2: 26 mmol/L (ref 22–32)
Calcium: 7.4 mg/dL — ABNORMAL LOW (ref 8.9–10.3)
Chloride: 109 mmol/L (ref 98–111)
Creatinine, Ser: 0.93 mg/dL (ref 0.61–1.24)
GFR calc Af Amer: >60 mL/min (ref >60)
GFR calc non Af Amer: >60 mL/min (ref >60)
Glucose, Bld: 87 mg/dL (ref 70–99)
Potassium: 3.4 mmol/L — ABNORMAL LOW (ref 3.5–5.1)
Sodium: 141 mmol/L (ref 135–145)

## 2020-02-12 LAB — CBC
HCT: 32.8 % — ABNORMAL LOW (ref 39.0–52.0)
Hemoglobin: 10.7 g/dL — ABNORMAL LOW (ref 13.0–17.0)
MCH: 28.3 pg (ref 26.0–34.0)
MCHC: 32.6 g/dL (ref 30.0–36.0)
MCV: 86.8 fL (ref 80.0–100.0)
Platelets: 197 10*3/uL (ref 150–400)
RBC: 3.78 MIL/uL — ABNORMAL LOW (ref 4.22–5.81)
RDW: 12.8 % (ref 11.5–15.5)
WBC: 6.2 10*3/uL (ref 4.0–10.5)
nRBC: 0 % (ref 0.0–0.2)

## 2020-02-12 LAB — GLUCOSE, CAPILLARY
Glucose-Capillary: 101 mg/dL — ABNORMAL HIGH (ref 70–99)
Glucose-Capillary: 127 mg/dL — ABNORMAL HIGH (ref 70–99)
Glucose-Capillary: 93 mg/dL (ref 70–99)
Glucose-Capillary: 79 mg/dL (ref 70–99)
Glucose-Capillary: 84 mg/dL (ref 70–99)
Glucose-Capillary: 88 mg/dL (ref 70–99)

## 2020-02-12 NOTE — Progress Notes (Signed)
Family Medicine Teaching Service Daily Progress Note Intern Pager: [phone]  Patient name: Steven Harvey Medical record number: [id number] Date of birth: 10-14-1966 Age: 54 y.o. Gender: male  Primary Care Provider: Patient, No Pcp Per Consultants: None Code Status: Full  Pt Overview and Major Events to Date:  2/27-admitted 2/28-transitioned off Endo tool 3/5- 120 mL PO intake (first time)  Assessment and Plan: [name] is a 54 y.o. male presenting with decreased PO intake, emesis, and hypothermia and COVID+. PMH is significant for T2DM, HTN, HLD, Alcoholism, and homelessness.   Intractable nausea suspected related to COVID infection, DKA- respiratory status stable, on RA. Patient endorses improved nausea. Yesterday patient ate 15% of both meals w/o emesis, total of 128mL of fluid PO. Encouraged patient to drink as much as possible today. Patient also endorses pain of his RUE, states that it is "tight" and "needs to relax." He also complained of pain in RUE when receiving K through IV on that arm several days ago, but since PICC line placed has not complained again until today. On exam, RUE is not warm, edematous, or erythematous, full ROM, not tender to palpation. Patient is currently on lovenox for DVT ppx, but since higher risk of clot with COVID-19, will order RUE doppler US today to r/o DVT. - encouraged patient to continue to increase PO intake as tolerated - consider enteral feeds if needed - continue scopolamine patch and tigan - consult to dietitian for nutrition recommendations  - continue multivitamin and nutritional shakes - GI recommends OP workup including colon cancer screening -f/u RUE doppler US  Possible urinary retention Foley d/c'd yesterday, condom cath placed this morning, no UOP so far. Requested condom cath be removed as patient is able to use urinal if needed. Placed orders for bladder scan at [facility], if no UOP. -bladder scan at 1400  Prolonged QTc- 525  yesterday and has been stable -Avoid QTc prolonging agents -Continue cardiac monitoring -Replete electrolytes as needed   Hypokalemia  Hypernatremia- resolved  K+ 3.5, Na+ 139 yesterday, awaiting today's results. Will hopefully stay stable with patient increasing PO intake. Continue IV fluids until PO intake is adequate, rate reduced to 50 mL/hr to encourage PO hydration. -Daily BMP -D5 1/2 NS at 50 mL/hr  T2DM-stable, DKA resolved Blood sugars 149-101 overnight. On 15u lantus and SSI. Home med is metformin but would still hold off as to not exacerbate any more GI symptoms.  -Continue lantus to 15U daily -Daily BMP  AKI- resolved Creatinine 1.02 yesterady, awaiting today's results -Daily BMP  HTN- chronic, stable. 131/85 latest - consider restarting lisinopril home med for ppx benefits.   HLD- chronic -continue rosuvastatin 20mg  daily   EtOH abuse/homelessness  CIWA scores were trended and negative. CSW consulted and provided with resources.  - f/u CSW for OP placement   FEN/GI: full liquid Prophylaxis: Lovenox  Disposition: PT/OT recommending SNF. CSW seeking placement options  Subjective:  Patient complaining of R arm pain, states that it is "tight" and needs to "relax." Will offer tylenol. Of greater concern is that pt only drank 170 mL yesterday, requested that he try to reach 250-354mL at least as a goal today.  Objective: Temp:  [98.1 F (36.7 C)-99.4 F (37.4 C)] 98.8 F (37.1 C) (03/07 0825) Pulse Rate:  [89-98] 93 (03/07 0825) Resp:  [12-20] 20 (03/07 0825) BP: (115-140)/(78-85) 131/85 (03/07 0825) SpO2:  [95 %-97 %] 96 % (03/07 0825) Physical Exam: General: NAD, laying in bed Cardiovascular: rrr, no m/r/g Respiratory: CTAB, no  increased WOB, no rales or rhonchi Abdomen: Scaphoid abdomen. Soft, NT, ND, normal bowel sounds present Extremities: Cachectic appearing extremities.  No edema. No swelling, erythema, warmth of RUE.  Laboratory: Recent Labs   Lab 02/09/20 0619 02/10/20 0315 02/11/20 0345  WBC 8.4 7.2 7.0  HGB 11.4* 10.8* 10.7*  HCT 34.0* 33.1* 33.7*  PLT 182 182 196   Recent Labs  Lab 02/09/20 0619 02/09/20 1429 02/10/20 0315 02/10/20 1800 02/11/20 0345  NA 144   < > 143 137 139  K 3.7   < > 3.9 4.0 3.5  CL 112*   < > 110 108 105  CO2 25   < > 25 23 25   BUN 11   < > 9 7 6   CREATININE 0.88   < > 0.81 0.84 1.02  CALCIUM 7.3*   < > 7.4* 7.2* 7.3*  PROT 4.5*  --  4.4*  --  4.3*  BILITOT 0.7  --  0.7  --  0.6  ALKPHOS 65  --  66  --  69  ALT 9  --  10  --  11  AST 15  --  18  --  21  GLUCOSE 145*   < > 134* 178* 172*   < > = values in this interval not displayed.   Imaging/Diagnostic Tests: EKG SITE RITE  Result Date: 02/09/2020 If Site Rite image not attached, placement could not be confirmed due to current cardiac rhythm.   [place], MD 02/12/2020, 8:39 AM PGY-1, [facility] Family Medicine FPTS Intern pager: [phone], text pages welcome

## 2020-02-13 ENCOUNTER — Inpatient Hospital Stay (HOSPITAL_COMMUNITY): Payer: Self-pay

## 2020-02-13 DIAGNOSIS — M7989 Other specified soft tissue disorders: Secondary | ICD-10-CM

## 2020-02-13 DIAGNOSIS — M79609 Pain in unspecified limb: Secondary | ICD-10-CM

## 2020-02-13 DIAGNOSIS — I808 Phlebitis and thrombophlebitis of other sites: Secondary | ICD-10-CM

## 2020-02-13 LAB — CBC
HCT: 31.6 % — ABNORMAL LOW (ref 39.0–52.0)
Hemoglobin: 9.9 g/dL — ABNORMAL LOW (ref 13.0–17.0)
MCH: 27.6 pg (ref 26.0–34.0)
MCHC: 31.3 g/dL (ref 30.0–36.0)
MCV: 88 fL (ref 80.0–100.0)
Platelets: 201 10*3/uL (ref 150–400)
RBC: 3.59 MIL/uL — ABNORMAL LOW (ref 4.22–5.81)
RDW: 12.6 % (ref 11.5–15.5)
WBC: 7 10*3/uL (ref 4.0–10.5)
nRBC: 0 % (ref 0.0–0.2)

## 2020-02-13 LAB — PHOSPHORUS: Phosphorus: 2.7 mg/dL (ref 2.5–4.6)

## 2020-02-13 LAB — BASIC METABOLIC PANEL
Anion gap: 9 (ref 5–15)
BUN: <5 mg/dL — ABNORMAL LOW (ref 6–20)
CO2: 25 mmol/L (ref 22–32)
Calcium: 7.3 mg/dL — ABNORMAL LOW (ref 8.9–10.3)
Chloride: 106 mmol/L (ref 98–111)
Creatinine, Ser: 0.82 mg/dL (ref 0.61–1.24)
GFR calc Af Amer: >60 mL/min (ref >60)
GFR calc non Af Amer: >60 mL/min (ref >60)
Glucose, Bld: 79 mg/dL (ref 70–99)
Potassium: 3 mmol/L — ABNORMAL LOW (ref 3.5–5.1)
Sodium: 140 mmol/L (ref 135–145)

## 2020-02-13 LAB — GLUCOSE, CAPILLARY
Glucose-Capillary: 73 mg/dL (ref 70–99)
Glucose-Capillary: 80 mg/dL (ref 70–99)
Glucose-Capillary: 82 mg/dL (ref 70–99)
Glucose-Capillary: 81 mg/dL (ref 70–99)
Glucose-Capillary: 99 mg/dL (ref 70–99)

## 2020-02-13 LAB — MAGNESIUM: Magnesium: 1.8 mg/dL (ref 1.7–2.4)

## 2020-02-13 MED ORDER — POTASSIUM CHLORIDE 20 MEQ PO PACK
40.0000 meq | PACK | Freq: Once | ORAL | Status: AC
  Administered 2020-02-13: 40 meq via ORAL
  Filled 2020-02-13: qty 2

## 2020-02-13 MED ORDER — POTASSIUM CHLORIDE CRYS ER 20 MEQ PO TBCR
40.0000 meq | EXTENDED_RELEASE_TABLET | Freq: Once | ORAL | Status: AC
  Administered 2020-02-13: 40 meq via ORAL
  Filled 2020-02-13: qty 2

## 2020-02-13 MED ORDER — POTASSIUM CHLORIDE CRYS ER 20 MEQ PO TBCR: 40.0000 meq | EXTENDED_RELEASE_TABLET | Freq: Once | ORAL | Status: DC

## 2020-02-13 MED ORDER — DICLOFENAC SODIUM 1 % EX GEL
2.0000 g | Freq: Four times a day (QID) | CUTANEOUS | Status: DC | PRN
  Administered 2020-02-16: 2 g via TOPICAL
  Filled 2020-02-13: qty 100

## 2020-02-13 MED ORDER — MAGNESIUM SULFATE 2 GM/50ML IV SOLN
2.0000 g | Freq: Once | INTRAVENOUS | Status: AC
  Administered 2020-02-13: 2 g via INTRAVENOUS
  Filled 2020-02-13: qty 50

## 2020-02-13 MED ORDER — INSULIN GLARGINE 100 UNIT/ML ~~LOC~~ SOLN
10.0000 [IU] | Freq: Every day | SUBCUTANEOUS | Status: DC
  Administered 2020-02-14: 10 [IU] via SUBCUTANEOUS
  Filled 2020-02-13 (×2): qty 0.1

## 2020-02-13 NOTE — Progress Notes (Signed)
Family Medicine Teaching Service Daily Progress Note Intern Pager: [phone]  Patient name: Steven Harvey Medical record number: [id number] Date of birth: [birth year] Age: 54 y.o. Gender: male  Primary Care Provider: Patient, No Pcp Per Consultants: None Code Status: Full  Pt Overview and Major Events to Date:  2/27-admitted 2/28-transitioned off Endo tool 3/5- 120 mL PO intake (first time)  Assessment and Plan: Steven Harvey is a 54 y.o. male presenting with decreased PO intake, emesis, and hypothermia and COVID+. PMH is significant for T2DM, HTN, HLD, Alcoholism, and homelessness.   Intractable nausea suspected related to COVID infection, DKA- respiratory status stable, on RA. Patient endorses improved nausea.Yesterday patient reached goal of doubling PO intake to 326m. He is already drinking Boost this morning. Advanced diet to soft. Encouraged patient to eat as much as possible today.  - encouraged patient to continue to increase PO intake as tolerated - continue scopolamine patch and tigan - consult to dietitian for nutrition recommendations  - continue multivitamin and nutritional shakes - GI recommends OP workup including colon cancer screening  RUE Thrombophlebitis Patient has increasing pain in RUE, now warm to touch in comparison to LUE. No erythema. RUE doppler UK[place]aperformed today shows no e/o DVT, but superficial vein thrombosis in R basiphilic and cephalic veins. Likely from extravasated IVs last week. - Acetaminophen for pain - Diclofenac gel - Warm compresses  Prolonged QTc- 525 3/6 and stable -Avoid QTc prolonging agents -Continue cardiac monitoring -Replete electrolytes as needed   Hypokalemia  Hypernatremia- resolved  K+ 3.0, Na+ 140 today. Repleted K with 40 mEq K-Dur packet x2. Mg 1.8 today. Will hopefully stay stable with patient increasing PO intake. Continue IV fluids until PO intake is adequate, rate reduced to 50 mL/hr to encourage PO hydration. -Daily BMP,  Mg -D5 1/2 NS at 50 mL/hr  T2DM-stable, DKA resolved Blood sugars 70-80s overnight. On 15u lantus and SSI, will decrease to 10U lantus today. Home med is metformin but would still hold off as to not exacerbate any more GI symptoms.  -Continue lantus to 10U daily -Daily BMP  AKI- resolved Creatinine 0.82 today. -Daily BMP  HTN- chronic, stable. 149/78 latest - consider restarting lisinopril home med for ppx benefits.   HLD- chronic -continue rosuvastatin 275mdaily   EtOH abuse/homelessness  CIWA scores were trended and negative. CSW consulted and provided with resources.  - f/u CSW for OP placement   FEN/GI: full liquid Prophylaxis: Lovenox  Disposition: PT/OT recommending SNF. CSW seeking placement options  Subjective:  Patient met liquid intake goal yesterday, already drinking a boost today. Still has RUE pain, now RUE warmer than left.  Objective: Temp:  [98.1 F (36.7 C)-98.8 F (37.1 C)] 98.7 F (37.1 C) (03/08 0336) Pulse Rate:  [85-93] 87 (03/08 0336) Resp:  [12-23] 19 (03/08 0400) BP: (100-134)/(78-85) 129/80 (03/08 0336) SpO2:  [96 %-97 %] 96 % (03/08 0336) Physical Exam: General: NAD, laying in bed Cardiovascular: rrr, no m/r/g Respiratory: CTAB, no increased WOB, no rales or rhonchi Abdomen: Scaphoid abdomen. Soft, NT, ND, normal bowel sounds present Extremities: Cachectic appearing extremities.  No edema. RUE warmer than LUE, tender to palpation, no swelling or erythema.  Laboratory: Recent Labs  Lab 02/11/20 0345 02/12/20 1513 02/13/20 0313  WBC 7.0 6.2 7.0  HGB 10.7* 10.7* 9.9*  HCT 33.7* 32.8* 31.6*  PLT 196 197 201   Recent Labs  Lab 02/09/20 0619 02/09/20 1429 02/10/20 0315 02/10/20 1800 02/11/20 0345 02/12/20 1513 02/13/20 0313  NA 144   < >  143   < > 139 141 140  K 3.7   < > 3.9   < > 3.5 3.4* 3.0*  CL 112*   < > 110   < > 105 109 106  CO2 25   < > 25   < > _0 BUN 11   < > 9   < > 6 <5* <5*  CREATININE 0.88   < >  0.81   < > 1.02 0.93 0.82  CALCIUM 7.3*   < > 7.4*   < > 7.3* 7.4* 7.3*  PROT 4.5*  --  4.4*  --  4.3*  --   --   BILITOT 0.7  --  0.7  --  0.6  --   --   ALKPHOS 65  --  66  --  69  --   --   ALT 9  --  10  --  11  --   --   AST 15  --  18  --  21  --   --   GLUCOSE 145*   < > 134*   < > 172* 87 79   < > = values in this interval not displayed.   Imaging/Diagnostic Tests: [place] EKG SITE RITE  Result Date: 02/09/2020 If Site Rite image not attached, placement could not be confirmed due to current cardiac rhythm.   Steven Damme, MD 02/13/2020, 6:44 AM PGY-1, [place] Intern pager: [phone], text pages welcome

## 2020-02-13 NOTE — Progress Notes (Signed)
Right upper extremity venous duplex completed.  02/13/2020 11:53 AM Gertie Fey, MHA, RVT, RDCS, RDMS

## 2020-02-14 LAB — CBC
HCT: 30.8 % — ABNORMAL LOW (ref 39.0–52.0)
Hemoglobin: 9.8 g/dL — ABNORMAL LOW (ref 13.0–17.0)
MCH: 27.9 pg (ref 26.0–34.0)
MCHC: 31.8 g/dL (ref 30.0–36.0)
MCV: 87.7 fL (ref 80.0–100.0)
Platelets: 208 10*3/uL (ref 150–400)
RBC: 3.51 MIL/uL — ABNORMAL LOW (ref 4.22–5.81)
RDW: 12.6 % (ref 11.5–15.5)
WBC: 6.3 10*3/uL (ref 4.0–10.5)
nRBC: 0 % (ref 0.0–0.2)

## 2020-02-14 LAB — PHOSPHORUS: Phosphorus: 2.5 mg/dL (ref 2.5–4.6)

## 2020-02-14 LAB — BASIC METABOLIC PANEL
Anion gap: 9 (ref 5–15)
BUN: <5 mg/dL — ABNORMAL LOW (ref 6–20)
CO2: 25 mmol/L (ref 22–32)
Calcium: 7.5 mg/dL — ABNORMAL LOW (ref 8.9–10.3)
Chloride: 106 mmol/L (ref 98–111)
Creatinine, Ser: 0.93 mg/dL (ref 0.61–1.24)
GFR calc Af Amer: >60 mL/min (ref >60)
GFR calc non Af Amer: >60 mL/min (ref >60)
Glucose, Bld: 183 mg/dL — ABNORMAL HIGH (ref 70–99)
Potassium: 3.4 mmol/L — ABNORMAL LOW (ref 3.5–5.1)
Sodium: 140 mmol/L (ref 135–145)

## 2020-02-14 LAB — MAGNESIUM: Magnesium: 2 mg/dL (ref 1.7–2.4)

## 2020-02-14 LAB — GLUCOSE, CAPILLARY
Glucose-Capillary: 159 mg/dL — ABNORMAL HIGH (ref 70–99)
Glucose-Capillary: 180 mg/dL — ABNORMAL HIGH (ref 70–99)
Glucose-Capillary: 187 mg/dL — ABNORMAL HIGH (ref 70–99)
Glucose-Capillary: 192 mg/dL — ABNORMAL HIGH (ref 70–99)
Glucose-Capillary: 196 mg/dL — ABNORMAL HIGH (ref 70–99)
Glucose-Capillary: 242 mg/dL — ABNORMAL HIGH (ref 70–99)

## 2020-02-14 MED ORDER — ALTEPLASE 2 MG IJ SOLR
2.0000 mg | Freq: Once | INTRAMUSCULAR | Status: AC
  Administered 2020-02-14: 2 mg
  Filled 2020-02-14: qty 2

## 2020-02-14 MED ORDER — LISINOPRIL 5 MG PO TABS
5.0000 mg | ORAL_TABLET | Freq: Every day | ORAL | Status: DC
  Administered 2020-02-14 – 2020-02-22 (×9): 5 mg via ORAL
  Filled 2020-02-14 (×9): qty 1

## 2020-02-14 MED ORDER — POTASSIUM CHLORIDE 20 MEQ PO PACK
40.0000 meq | PACK | Freq: Two times a day (BID) | ORAL | Status: AC
  Administered 2020-02-14 (×2): 40 meq via ORAL
  Filled 2020-02-14 (×2): qty 2

## 2020-02-14 NOTE — Progress Notes (Signed)
Physical Therapy Treatment Patient Details Name: Steven Harvey MRN: [id number] DOB: 12-04-1966 Today's Date: 02/14/2020    History of Present Illness 54 year old male with recent COVID-63 dx, admitted 02/04/20 fatigue, hypothermia and gastritis. Found to have DKA. CXR with likely PNA. PMH includes DM2, HTN, HLD.    PT Comments    Patient unsteady with ambulation in the hallway, path deviations, scissoring events, requiring contact guard assistance. Patient is at risk for falls and is not safe to ambulate on his own at this time. Patient confirms he was independent PTA. He states that he likes to walk and used to walk 6 miles. Patient motivated to participate in PT session tomorrow with this PT and notes that he wants to try to do 4 laps.  Continued recommendation for skilled PT services and discharge to SNF for short term rehabilitation to progress patient back to independence.    Follow Up Recommendations  SNF;Supervision/Assistance - 24 hour     Equipment Recommendations  Other (comment)(TBD (may trial RW))       Precautions / Restrictions Precautions Precautions: Fall;Other (comment) Precaution Comments: Patient tends to feel dizzy, nauseous, needs encouragement Restrictions Weight Bearing Restrictions: No    Mobility  Bed Mobility Overal bed mobility: Modified Independent Bed Mobility: Supine to Sit;Sit to Supine     Supine to sit: Modified independent (Device/Increase time) Sit to supine: Modified independent (Device/Increase time)   General bed mobility comments: (Requested assistance with sheet/blanket mgmt)  Transfers Overall transfer level: Needs assistance Equipment used: None Transfers: Sit to/from Stand Sit to Stand: Supervision   Ambulation/Gait Ambulation/Gait assistance: Min guard Gait Distance (Feet): 100 Feet Assistive device: None Gait Pattern/deviations: Scissoring;Narrow base of support;Staggering right;Staggering left(path deviations)     General  Gait Details: Patient more unsteady with continued ambulation in hallway, path deviations, scissoring approx 15% of the time. RLE with impaired coordination during terminal swing phase.      Balance Overall balance assessment: Needs assistance Sitting-balance support: Feet supported Sitting balance-Leahy Scale: Good     Standing balance support: No upper extremity supported Standing balance-Leahy Scale: Good Standing balance comment: Patient reports decreased standing tolerance due to dizziness.      Cognition Arousal/Alertness: Awake/alert Behavior During Therapy: WFL for tasks assessed/performed       General Comments General comments (skin integrity, edema, etc.): Oxygen and HR stable on room air. BP 129/92 taken sitting at EOB after gait trial.      Pertinent Vitals/Pain Pain Assessment: Faces Faces Pain Scale: No hurt        Prior Function Patient confirms he was independent, likes to walk.   PT Goals (current goals can now be found in the care plan section) Acute Rehab PT Goals Patient Stated Goal: to walk 4 laps Progress towards PT goals: Progressing toward goals    Frequency    Min 3X/week      PT Plan Current plan remains appropriate       AM-PAC PT "6 Clicks" Mobility   Outcome Measure  Help needed turning from your back to your side while in a flat bed without using bedrails?: None Help needed moving from lying on your back to sitting on the side of a flat bed without using bedrails?: None Help needed moving to and from a bed to a chair (including a wheelchair)?: A Little Help needed standing up from a chair using your arms (e.g., wheelchair or bedside chair)?: None Help needed to walk in hospital room?: A Little Help needed climbing 3-5  steps with a railing? : A Little 6 Click Score: 21    End of Session   Activity Tolerance: Patient tolerated treatment well Patient left: in bed;with call bell/phone within reach;with bed alarm set Nurse  Communication: Mobility status PT Visit Diagnosis: Unsteadiness on feet (R26.81);Other abnormalities of gait and mobility (R26.89);Difficulty in walking, not elsewhere classified (R26.2)     Time: 1499-6924 PT Time Calculation (min) (ACUTE ONLY): 19 min  Charges:  $Gait Training: 8-22 mins                     Angelene Giovanni, DPT, PT Acute Rehab [phone] office     Angelene Giovanni 02/14/2020, 12:51 PM

## 2020-02-14 NOTE — Progress Notes (Signed)
Nutrition Follow-up  DOCUMENTATION CODES:   Not applicable  INTERVENTION:  Provide Ensure Enlive po QID, each supplement provides 350 kcal and 20 grams of protein.  Provide 30 ml Prostat po TID, each supplement provides 100 kcal and 15 grams of protein.   Encourage adequate PO intake.   NUTRITION DIAGNOSIS:   Increased nutrient needs related to acute illness as evidenced by estimated needs; ongoing  GOAL:   Patient will meet greater than or equal to 90% of their needs; progressing  MONITOR:   PO intake, Supplement acceptance, Skin, Weight trends, Labs, I & O's  REASON FOR ASSESSMENT:   Consult Assessment of nutrition requirement/status  ASSESSMENT:   54 y.o. male presenting with decreased PO intake, emesis, and hypothermia. PMH is significant for recent COVID infection, T2DM, HTN, HLD, Alcoholism, and homelessness. Recently diagnosed with covid, which likely triggered his DKA.  Meal completion has been 50-100%. Intake has improved. Pt has been tolerating his PO intake. Pt currently has Ensure and Prostat ordered with varied intake. RD to continue with current orders to aid in caloric and protein needs. Pt encouraged to eat his food at meals and to drink his supplements.    Labs and medications reviewed.   Diet Order:   Diet Order            DIET SOFT Room service appropriate? Yes; Fluid consistency: Thin  Diet effective now              EDUCATION NEEDS:   Not appropriate for education at this time  Skin:  Skin Assessment: Reviewed RN Assessment  Last BM:  3/4  Height:   Ht Readings from Last 1 Encounters:  02/04/20 5\' 7"  (1.702 m)    Weight:   Wt Readings from Last 1 Encounters:  02/04/20 57.2 kg    Ideal Body Weight:  67.27 kg  BMI:  Body mass index is 19.73 kg/m.  Estimated Nutritional Needs:   Kcal:  1800-2000  Protein:  85-100 grams  Fluid:  >/= 1.8 L/day    02/06/20, MS, RD, LDN RD pager number/after hours weekend pager  number on Amion.

## 2020-02-14 NOTE — Progress Notes (Signed)
Family Medicine Teaching Service Daily Progress Note Intern Pager: [phone]  Patient name: Steven Harvey Medical record number: [id number] Date of birth: May 15, 1966 Age: 54 y.o. Gender: male  Primary Care Provider: Patient, No Pcp Per Consultants: None Code Status: Full  Pt Overview and Major Events to Date:  2/27-admitted 2/28-transitioned off Endo tool 3/5- 120 mL PO intake (first time)  Assessment and Plan: Steven Harvey is a 54 y.o. male presenting with decreased PO intake, emesis, and hypothermia and COVID+. PMH is significant for T2DM, HTN, HLD, Alcoholism, and homelessness.   Intractable nausea suspected related to COVID infection, DKA- respiratory status stable, on RA. Patient endorses no nausea and he ate his full meal and lunch and dinner today. Will continue to advance his diet. Overall patient feels much better. Will reconsult PT/OT for new recommendations now that patient has improved function. Dispo will depend upon these recommendations and SW input as patient is homeless and will need to be in quarantine for 21 days (March 15). - encouraged patient to continue to increase PO intake as tolerated - continue scopolamine patch and tigan - consult to dietitian for nutrition recommendations  - continue multivitamin and nutritional shakes - GI recommends OP workup including colon cancer screening -Reconsult PT/OT for disposition recommendations -SW  RUE Thrombophlebitis- improved RUE doppler US performed 3/8 shows no e/o DVT, but superficial vein thrombosis in R basiphilic and cephalic veins. Likely from extravasated IVs last week.  - Acetaminophen for pain - Diclofenac gel - Warm compresses  Prolonged QTc- 525 3/6 and stable -Avoid QTc prolonging agents -Continue cardiac monitoring -Replete electrolytes as needed   Hypokalemia  Hypernatremia- resolved  K+ 3.4, Na+ 140 today. Repleted K with 40 mEq potassium chloride packet x2. Will hopefully stay stable with patient  increasing PO intake. Discontinue IVF. -Daily BMP, Mg  T2DM-stable, DKA resolved Blood sugars 80-180s overnight. On 10U lantus. Home med is metformin but would still hold off as to not exacerbate any more GI symptoms.  -Continue lantus to 10U daily -Daily BMP  AKI- resolved Creatinine 0.93 today. -Daily BMP  HTN- chronic, stable. 103/84 latest - restart lisinopril 5 mg today  HLD- chronic -continue rosuvastatin 20mg  daily   EtOH abuse/homelessness  CIWA scores were trended and negative. CSW consulted and provided with resources.  - f/u CSW for OP placement   FEN/GI: full liquid Prophylaxis: Lovenox  Disposition: PT/OT recommending SNF, will reconsult them today due to patient's improvement. Dispo pending this eval and SW input due to patient's homelessness.  Subjective:  Patient feeling much better today, overall cheerful and upbeat, clearly improved.  Objective: Temp:  [98.1 F (36.7 C)-98.4 F (36.9 C)] 98.4 F (36.9 C) (03/09 0033) Pulse Rate:  [74-90] 81 (03/09 0400) Resp:  [12-20] 15 (03/09 0400) BP: (103-149)/(74-84) 103/84 (03/09 0400) SpO2:  [94 %-98 %] 94 % (03/09 0400) Physical Exam: General: NAD, laying in bed Cardiovascular: rrr, no m/r/g Respiratory: CTAB, no increased WOB, no rales or rhonchi Abdomen: Scaphoid abdomen. Soft, NT, ND, normal bowel sounds present Extremities: Cachectic appearing extremities.  No edema. RUE same temperature as LUE, tender to palpation, no swelling or erythema.  Laboratory: Recent Labs  Lab 02/12/20 1513 02/13/20 0313 02/14/20 0423  WBC 6.2 7.0 6.3  HGB 10.7* 9.9* 9.8*  HCT 32.8* 31.6* 30.8*  PLT 197 201 208   Recent Labs  Lab 02/09/20 0619 02/09/20 1429 02/10/20 0315 02/10/20 1800 02/11/20 0345 02/11/20 0345 02/12/20 1513 02/13/20 0313 02/14/20 0423  NA 144   < > 143   < >  139   < > 141 140 140  K 3.7   < > 3.9   < > 3.5   < > 3.4* 3.0* 3.4*  CL 112*   < > 110   < > 105   < > 109 106 106  CO2 25   <  > 25   < > 25   < > 26 25 25   BUN 11   < > 9   < > 6   < > <5* <5* <5*  CREATININE 0.88   < > 0.81   < > 1.02   < > 0.93 0.82 0.93  CALCIUM 7.3*   < > 7.4*   < > 7.3*   < > 7.4* 7.3* 7.5*  PROT 4.5*  --  4.4*  --  4.3*  --   --   --   --   BILITOT 0.7  --  0.7  --  0.6  --   --   --   --   ALKPHOS 65  --  66  --  69  --   --   --   --   ALT 9  --  10  --  11  --   --   --   --   AST 15  --  18  --  21  --   --   --   --   GLUCOSE 145*   < > 134*   < > 172*   < > 87 79 183*   < > = values in this interval not displayed.   Imaging/Diagnostic Tests: VAS UPPER EXTREMITY VENOUS DUPLEX  Result Date: 02/13/2020 UPPER VENOUS STUDY  Indications: Pain, and Swelling Limitations: Patient position. Comparison Study: No prior study Performing Technologist: 04/14/2020 MHA, RDMS, RVT, RDCS  Examination Guidelines: A complete evaluation includes B-mode imaging, spectral Doppler, color Doppler, and power Doppler as needed of all accessible portions of each vessel. Bilateral testing is considered an integral part of a complete examination. Limited examinations for reoccurring indications may be performed as noted.  Right Findings: +----------+------------+---------+-----------+----------+-------+ RIGHT     CompressiblePhasicitySpontaneousPropertiesSummary +----------+------------+---------+-----------+----------+-------+ IJV           Full       Yes       Yes                      +----------+------------+---------+-----------+----------+-------+ Subclavian    Full       Yes       Yes                      +----------+------------+---------+-----------+----------+-------+ Axillary      Full       Yes       Yes                      +----------+------------+---------+-----------+----------+-------+ Brachial      Full       Yes       Yes                      +----------+------------+---------+-----------+----------+-------+ Radial        Full                                           +----------+------------+---------+-----------+----------+-------+ Ulnar  Full                                          +----------+------------+---------+-----------+----------+-------+ Cephalic      None                                   Acute  +----------+------------+---------+-----------+----------+-------+ Basilic       None                                   Acute  +----------+------------+---------+-----------+----------+-------+  Left Findings: +----------+------------+---------+-----------+----------+--------------+ LEFT      CompressiblePhasicitySpontaneousProperties   Summary     +----------+------------+---------+-----------+----------+--------------+ Subclavian                                          Not visualized +----------+------------+---------+-----------+----------+--------------+  Summary:  Right: No evidence of deep vein thrombosis in the upper extremity. Findings consistent with acute superficial vein thrombosis involving the right basilic vein and right cephalic vein.  *See table(s) above for measurements and observations.  Diagnosing physician: Fabienne Bruns MD Electronically signed by Fabienne Bruns MD on 02/13/2020 at 3:22:23 PM.    Final     Shirlean Mylar, MD 02/14/2020, 7:56 AM PGY-1, [facility] Family Medicine FPTS Intern pager: [phone], text pages welcome

## 2020-02-14 NOTE — Progress Notes (Signed)
Patient ate almost all of his meal leftover from dinner last night.  He says he has a much better appetite and will try to eat his breakfast later.

## 2020-02-14 NOTE — TOC Progression Note (Signed)
Transition of Care [facility]) - Progression Note    Patient Details  Name: Steven Harvey MRN: [id number] Date of Birth: Aug 06, 1966  [facility] of [facility] CM/SW Contact  Mearl Latin, LCSW Phone Number: 02/14/2020, 8:30 AM  Clinical Narrative:    Patient continues to have no bed offers and remains self-limiting.         Expected Discharge Plan and Services                                                 Social Determinants of Health (SDOH) Interventions    Readmission Risk Interventions No flowsheet data found.

## 2020-02-14 NOTE — Progress Notes (Signed)
Occupational Therapy Treatment Patient Details Name: Steven Harvey MRN: [id number] DOB: 1966-04-20 Today's Date: 02/14/2020    History of present illness 54 year old male with recent COVID-19 dx, admitted 02/04/20 fatigue, hypothermia and gastritis. Found to have DKA. CXR with likely PNA. PMH includes DM2, HTN, HLD.   OT comments  Pt more receptive to participating in therapy session today. Pt progressing with OT goals and goals updated accordingly. Pt Modified Independent for bed mobility. Pt Supervision for sit to stand, stand pivot, and mobility to/from bathroom without AD. Pt does present with unsteadiness and continued reports of dizzines, but no overt LOB. Pt Supervision for urination standing at toilet, with cues to use grab bars to steady self. Pt Supervision for hand hygiene standing at sink and setup to don new hospital gown. During activity, pt's HR increased to 140 at highest, but sustained in 120s during activity. Pt reports desire to get back to work and reports walking frequently outdoors prior to illness. Due to continued safety concerns with overall mobility during ADLs and active PLOF, continue to recommend SNF for short term rehab. Will continue to follow acutely and update recommendations as needed.    Follow Up Recommendations  SNF;Supervision/Assistance - 24 hour    Equipment Recommendations  Other (comment)(Defer to next venue )    Recommendations for Other Services      Precautions / Restrictions Precautions Precautions: Fall;Other (comment) Precaution Comments: Patient tends to feel dizzy, nauseous, needs encouragement Restrictions Weight Bearing Restrictions: No       Mobility Bed Mobility Overal bed mobility: Modified Independent Bed Mobility: Supine to Sit;Sit to Supine     Supine to sit: Modified independent (Device/Increase time) Sit to supine: Modified independent (Device/Increase time)   General bed mobility comments: (Requested assistance with  sheet/blanket mgmt)  Transfers Overall transfer level: Needs assistance Equipment used: None Transfers: Sit to/from [employer] Sit to Stand: Supervision Stand pivot transfers: Supervision            Balance Overall balance assessment: Needs assistance Sitting-balance support: Feet supported Sitting balance-Leahy Scale: Good     Standing balance support: No upper extremity supported Standing balance-Leahy Scale: Good Standing balance comment: Patient reports decreased standing tolerance due to dizziness.                            ADL either performed or assessed with clinical judgement   ADL Overall ADL's : Needs assistance/impaired Eating/Feeding: Set up;Sitting   Grooming: Supervision/safety;Standing Grooming Details (indicate cue type and reason): Supervision for safety during hand hygiene while standing at sink     Lower Body Bathing: Supervison/ safety;Sit to/from stand Lower Body Bathing Details (indicate cue type and reason): Pt Supervision for LB bathing while standing in bathroom to ensure safety  Upper Body Dressing : Set up;Sitting Upper Body Dressing Details (indicate cue type and reason): Setup to don new gown     Toilet Transfer: Supervision/safety;Ambulation;Regular Teacher, adult education Details (indicate cue type and reason): Supervision for ambulation to/from bathroom without AD Toileting- Clothing Manipulation and Hygiene: Supervision/safety;Sit to/from stand Toileting - Clothing Manipulation Details (indicate cue type and reason): Distant supervision for safety during urination in standing      Functional mobility during ADLs: Supervision/safety General ADL Comments: Pt did require encouragement to participate, but once OOB, pt did well. Mild unsteadiness and continued reports of dizziness during session, but did not greatly impact ability to complete tasks      Vision  Perception     Praxis      Cognition  Arousal/Alertness: Awake/alert Behavior During Therapy: WFL for tasks assessed/performed Overall Cognitive Status: No family/caregiver present to determine baseline cognitive functioning                                 General Comments: Pt limited by decreased motivation to participate        Exercises     Shoulder Instructions       General Comments Pt on RA with O2 stable. HR up to 140 during activity, but was not sustained. During activity, HR mostly remained in 120s. Pt did not report fatigue from task    Pertinent Vitals/ Pain       Pain Assessment: No/denies pain Faces Pain Scale: No hurt  Home Living                                          Prior Functioning/Environment              Frequency  Min 2X/week        Progress Toward Goals  OT Goals(current goals can now be found in the care plan section)  Progress towards OT goals: Progressing toward goals  Acute Rehab OT Goals Patient Stated Goal: to get back to work OT Goal Formulation: With patient Time For Goal Achievement: 02/19/20 Potential to Achieve Goals: Good ADL Goals Pt Will Perform Grooming: with modified independence;standing Pt Will Perform Lower Body Bathing: with set-up;sit to/from stand Pt Will Perform Upper Body Dressing: with modified independence;sitting Pt Will Perform Lower Body Dressing: with supervision;sitting/lateral leans Pt Will Transfer to Toilet: Independently;ambulating;regular height toilet Pt Will Perform Toileting - Clothing Manipulation and hygiene: Independently;sit to/from stand Pt/caregiver will Perform Home Exercise Program: Increased strength;Both right and left upper extremity;With theraband;Independently;With written HEP provided  Plan Discharge plan remains appropriate    Co-evaluation          OT goals addressed during session: ADL's and self-care      AM-PAC OT "6 Clicks" Daily Activity     Outcome Measure   Help from  another person eating meals?: None Help from another person taking care of personal grooming?: A Little Help from another person toileting, which includes using toliet, bedpan, or urinal?: A Little Help from another person bathing (including washing, rinsing, drying)?: A Little Help from another person to put on and taking off regular upper body clothing?: A Little Help from another person to put on and taking off regular lower body clothing?: A Lot 6 Click Score: 18    End of Session Equipment Utilized During Treatment: Other (comment)(none)  OT Visit Diagnosis: Muscle weakness (generalized) (M62.81);Other abnormalities of gait and mobility (R26.89)   Activity Tolerance Patient tolerated treatment well   Patient Left in bed;with call bell/phone within reach;with bed alarm set   Nurse Communication Mobility status;Other (comment)(request for meds)        Time: 6063-0160 OT Time Calculation (min): 32 min  Charges: OT General Charges $OT Visit: 1 Visit OT Treatments $Self Care/Home Management : 23-37 mins  Layla Maw, OTR/L   Layla Maw 02/14/2020, 2:47 PM

## 2020-02-15 LAB — GLUCOSE, CAPILLARY
Glucose-Capillary: 171 mg/dL — ABNORMAL HIGH (ref 70–99)
Glucose-Capillary: 178 mg/dL — ABNORMAL HIGH (ref 70–99)
Glucose-Capillary: 188 mg/dL — ABNORMAL HIGH (ref 70–99)
Glucose-Capillary: 199 mg/dL — ABNORMAL HIGH (ref 70–99)
Glucose-Capillary: 204 mg/dL — ABNORMAL HIGH (ref 70–99)
Glucose-Capillary: 254 mg/dL — ABNORMAL HIGH (ref 70–99)
Glucose-Capillary: 94 mg/dL (ref 70–99)

## 2020-02-15 LAB — PHOSPHORUS: Phosphorus: 2.6 mg/dL (ref 2.5–4.6)

## 2020-02-15 LAB — BASIC METABOLIC PANEL
Anion gap: 8 (ref 5–15)
BUN: <5 mg/dL — ABNORMAL LOW (ref 6–20)
CO2: 29 mmol/L (ref 22–32)
Calcium: 7.7 mg/dL — ABNORMAL LOW (ref 8.9–10.3)
Chloride: 108 mmol/L (ref 98–111)
Creatinine, Ser: 0.74 mg/dL (ref 0.61–1.24)
GFR calc Af Amer: >60 mL/min (ref >60)
GFR calc non Af Amer: >60 mL/min (ref >60)
Glucose, Bld: 103 mg/dL — ABNORMAL HIGH (ref 70–99)
Potassium: 3.4 mmol/L — ABNORMAL LOW (ref 3.5–5.1)
Sodium: 145 mmol/L (ref 135–145)

## 2020-02-15 LAB — CBC
HCT: 29.4 % — ABNORMAL LOW (ref 39.0–52.0)
Hemoglobin: 9.2 g/dL — ABNORMAL LOW (ref 13.0–17.0)
MCH: 27.9 pg (ref 26.0–34.0)
MCHC: 31.3 g/dL (ref 30.0–36.0)
MCV: 89.1 fL (ref 80.0–100.0)
Platelets: 228 10*3/uL (ref 150–400)
RBC: 3.3 MIL/uL — ABNORMAL LOW (ref 4.22–5.81)
RDW: 12.6 % (ref 11.5–15.5)
WBC: 6.9 10*3/uL (ref 4.0–10.5)
nRBC: 0 % (ref 0.0–0.2)

## 2020-02-15 LAB — MAGNESIUM: Magnesium: 2 mg/dL (ref 1.7–2.4)

## 2020-02-15 MED ORDER — POTASSIUM PHOSPHATES 15 MMOLE/5ML IV SOLN
30.0000 mmol | Freq: Once | INTRAVENOUS | Status: AC
  Administered 2020-02-15: 30 mmol via INTRAVENOUS
  Filled 2020-02-15: qty 10

## 2020-02-15 MED ORDER — GLIPIZIDE 5 MG PO TABS
5.0000 mg | ORAL_TABLET | Freq: Every day | ORAL | Status: DC
  Administered 2020-02-15 – 2020-02-16 (×2): 5 mg via ORAL
  Filled 2020-02-15 (×2): qty 1

## 2020-02-15 MED ORDER — POTASSIUM CHLORIDE 20 MEQ PO PACK
20.0000 meq | PACK | Freq: Once | ORAL | Status: AC
  Administered 2020-02-15: 20 meq via ORAL
  Filled 2020-02-15: qty 1

## 2020-02-15 MED ORDER — ALTEPLASE 2 MG IJ SOLR
2.0000 mg | Freq: Once | INTRAMUSCULAR | Status: DC
  Filled 2020-02-15: qty 2

## 2020-02-15 NOTE — Progress Notes (Signed)
Family Medicine Teaching Service Daily Progress Note Intern Pager: [phone]  Patient name: Steven Harvey Medical record number: [id number] Date of birth: December 18, 1965 Age: 54 y.o. Gender: male  Primary Care Provider: Patient, No Pcp Per Consultants: None Code Status: Full  Pt Overview and Major Events to Date:  2/27-admitted 2/28-transitioned off Endo tool 3/5- 120 mL PO intake (first time)  Assessment and Plan: Bynum Mccullars is a 54 y.o. male presenting with decreased PO intake, emesis, and hypothermia and COVID+. PMH is significant for T2DM, HTN, HLD, Alcoholism, and homelessness.   Intractable nausea suspected related to COVID infection, DKA- respiratory status stable, on RA. Patient endorses no nausea and he ate his full meal and lunch and dinner today. Diet fully advanced to carb modified. Overall patient feels much better. PT/OT recommend SNF; unfortunately no bed offers as pt has no payer source. Appreciate SW input as patient is homeless and will need to be in quarantine for 21 days (March 15). - encouraged patient to continue to increase PO intake as tolerated - continue scopolamine patch and tigan - consult to dietitian for nutrition recommendations  - continue multivitamin and nutritional shakes - GI recommends OP workup including colon cancer screening -SW: dispo pending  RUE Thrombophlebitis- improved RUE doppler US performed 3/8 shows no e/o DVT, but superficial vein thrombosis in R basiphilic and cephalic veins. Likely from extravasated IVs last week. Feeling better today. - Acetaminophen for pain - Diclofenac gel - Warm compresses  Prolonged QTc- 525 3/6 and stable -Avoid QTc prolonging agents -Continue cardiac monitoring -Replete electrolytes as needed   Hypokalemia  Hypernatremia- resolved  K+ 3.4, Na+ 145, phosphorus 2.6. Replete K and phos with Kphos today. Will hopefully stay stable with patient increasing PO intake. Discontinue IVF. -Daily BMP, Mg,  phos  T2DM-stable, DKA resolved Blood sugars 187-242 overnight, most recently 103. Aspart 11U. On 10U lantus. Because patient is not the best candidate for lantus outside the hospital, will start glipizide 5mg  today to see if we can simplify regimen and control CBG via oral regimen. -discontinue lantus to 10U daily -start glipizide 5 mg -Daily BMP  AKI- resolved Creatinine 0.74 today. -Daily BMP  HTN- chronic, stable. 113/73 latest - Continue lisinopril 5 mg today  HLD- chronic -continue rosuvastatin 20mg  daily   EtOH abuse/homelessness  CIWA scores were trended and negative. CSW consulted and provided with resources.  - f/u CSW for OP placement   FEN/GI: carb modified Prophylaxis: Lovenox  Disposition: PT/OT recommending SNF, patient does not have a payer source, is homeless, and is in quarantine for COVID-19 until 3/15. No bed offers at this time.  Subjective:  Patient feeling much better today  Objective: Temp:  [97.8 F (36.6 C)-98.7 F (37.1 C)] 97.8 F (36.6 C) (03/10 0100) Pulse Rate:  [81-85] 84 (03/10 0400) Resp:  [13-18] 17 (03/10 0400) BP: (113-121)/(71-73) 113/73 (03/10 0100) SpO2:  [95 %-97 %] 97 % (03/10 0400) Physical Exam: General: cachectic appearing older AA man, NAD, laying in bed Cardiovascular: rrr, no m/r/g Respiratory: CTAB, no increased WOB, no rales or rhonchi Abdomen: Scaphoid abdomen. Soft, NT, ND, normal bowel sounds present Extremities: Cachectic appearing extremities.  No edema. RUE same temperature as LUE, tender to palpation, no swelling or erythema.  Laboratory: Recent Labs  Lab 02/13/20 0313 02/14/20 0423 02/15/20 0411  WBC 7.0 6.3 6.9  HGB 9.9* 9.8* 9.2*  HCT 31.6* 30.8* 29.4*  PLT 201 208 228   Recent Labs  Lab 02/09/20 0619 02/09/20 1429 02/10/20 0315 02/10/20 1800  02/11/20 0345 02/12/20 1513 02/13/20 0313 02/14/20 0423 02/15/20 0411  NA 144   < > 143   < > 139   < > 140 140 145  K 3.7   < > 3.9   < > 3.5    < > 3.0* 3.4* 3.4*  CL 112*   < > 110   < > 105   < > 106 106 108  CO2 25   < > 25   < > 25   < > 25 25 29   BUN 11   < > 9   < > 6   < > <5* <5* <5*  CREATININE 0.88   < > 0.81   < > 1.02   < > 0.82 0.93 0.74  CALCIUM 7.3*   < > 7.4*   < > 7.3*   < > 7.3* 7.5* 7.7*  PROT 4.5*  --  4.4*  --  4.3*  --   --   --   --   BILITOT 0.7  --  0.7  --  0.6  --   --   --   --   ALKPHOS 65  --  66  --  69  --   --   --   --   ALT 9  --  10  --  11  --   --   --   --   AST 15  --  18  --  21  --   --   --   --   GLUCOSE 145*   < > 134*   < > 172*   < > 79 183* 103*   < > = values in this interval not displayed.   Imaging/Diagnostic Tests: VAS UPPER EXTREMITY VENOUS DUPLEX  Result Date: 02/13/2020 UPPER VENOUS STUDY  Indications: Pain, and Swelling Limitations: Patient position. Comparison Study: No prior study Performing Technologist: 04/14/2020 MHA, RDMS, RVT, RDCS  Examination Guidelines: A complete evaluation includes B-mode imaging, spectral Doppler, color Doppler, and power Doppler as needed of all accessible portions of each vessel. Bilateral testing is considered an integral part of a complete examination. Limited examinations for reoccurring indications may be performed as noted.  Right Findings: +----------+------------+---------+-----------+----------+-------+ RIGHT     CompressiblePhasicitySpontaneousPropertiesSummary +----------+------------+---------+-----------+----------+-------+ IJV           Full       Yes       Yes                      +----------+------------+---------+-----------+----------+-------+ Subclavian    Full       Yes       Yes                      +----------+------------+---------+-----------+----------+-------+ Axillary      Full       Yes       Yes                      +----------+------------+---------+-----------+----------+-------+ Brachial      Full       Yes       Yes                       +----------+------------+---------+-----------+----------+-------+ Radial        Full                                          +----------+------------+---------+-----------+----------+-------+  Ulnar         Full                                          +----------+------------+---------+-----------+----------+-------+ Cephalic      None                                   Acute  +----------+------------+---------+-----------+----------+-------+ Basilic       None                                   Acute  +----------+------------+---------+-----------+----------+-------+  Left Findings: +----------+------------+---------+-----------+----------+--------------+ LEFT      CompressiblePhasicitySpontaneousProperties   Summary     +----------+------------+---------+-----------+----------+--------------+ Subclavian                                          Not visualized +----------+------------+---------+-----------+----------+--------------+  Summary:  Right: No evidence of deep vein thrombosis in the upper extremity. Findings consistent with acute superficial vein thrombosis involving the right basilic vein and right cephalic vein.  *See table(s) above for measurements and observations.  Diagnosing physician: Fabienne Bruns MD Electronically signed by Fabienne Bruns MD on 02/13/2020 at 3:22:23 PM.    Final     Shirlean Mylar, MD 02/15/2020, 7:38 AM PGY-1, [facility] Family Medicine FPTS Intern pager: [phone], text pages welcome

## 2020-02-15 NOTE — TOC Progression Note (Signed)
Transition of Care [facility]) - Progression Note    Patient Details  Name: Steven Harvey MRN: [id number] Date of Birth: 1966/05/23  Transition of [facility]) CM/SW Contact  Ranelle Oyster, Student-Social Work Phone Number: 02/15/2020, 3:07 PM  Clinical Narrative:    MSW attempted to contact patient two times regarding initial assessment and if pt needed housing/alcohol resources. Pt told me to call back both times, and said that he was putting on clothes and was busy.  MSW will try to contact again tomorrow.         Expected Discharge Plan and Services                                                 Social Determinants of Health (SDOH) Interventions    Readmission Risk Interventions No flowsheet data found.

## 2020-02-16 ENCOUNTER — Inpatient Hospital Stay (HOSPITAL_COMMUNITY): Payer: Self-pay

## 2020-02-16 LAB — BASIC METABOLIC PANEL
Anion gap: 9 (ref 5–15)
BUN: <5 mg/dL — ABNORMAL LOW (ref 6–20)
CO2: 27 mmol/L (ref 22–32)
Calcium: 7.7 mg/dL — ABNORMAL LOW (ref 8.9–10.3)
Chloride: 105 mmol/L (ref 98–111)
Creatinine, Ser: 0.76 mg/dL (ref 0.61–1.24)
GFR calc Af Amer: >60 mL/min (ref >60)
GFR calc non Af Amer: >60 mL/min (ref >60)
Glucose, Bld: 190 mg/dL — ABNORMAL HIGH (ref 70–99)
Potassium: 3.6 mmol/L (ref 3.5–5.1)
Sodium: 141 mmol/L (ref 135–145)

## 2020-02-16 LAB — CBC
HCT: 29.3 % — ABNORMAL LOW (ref 39.0–52.0)
Hemoglobin: 9.1 g/dL — ABNORMAL LOW (ref 13.0–17.0)
MCH: 27.5 pg (ref 26.0–34.0)
MCHC: 31.1 g/dL (ref 30.0–36.0)
MCV: 88.5 fL (ref 80.0–100.0)
Platelets: 207 10*3/uL (ref 150–400)
RBC: 3.31 MIL/uL — ABNORMAL LOW (ref 4.22–5.81)
RDW: 12.7 % (ref 11.5–15.5)
WBC: 6.9 10*3/uL (ref 4.0–10.5)
nRBC: 0 % (ref 0.0–0.2)

## 2020-02-16 LAB — GLUCOSE, CAPILLARY
Glucose-Capillary: 142 mg/dL — ABNORMAL HIGH (ref 70–99)
Glucose-Capillary: 176 mg/dL — ABNORMAL HIGH (ref 70–99)
Glucose-Capillary: 186 mg/dL — ABNORMAL HIGH (ref 70–99)
Glucose-Capillary: 203 mg/dL — ABNORMAL HIGH (ref 70–99)
Glucose-Capillary: 224 mg/dL — ABNORMAL HIGH (ref 70–99)
Glucose-Capillary: 256 mg/dL — ABNORMAL HIGH (ref 70–99)

## 2020-02-16 LAB — RETICULOCYTES
Immature Retic Fract: 12.5 % (ref 2.3–15.9)
RBC.: 3.32 MIL/uL — ABNORMAL LOW (ref 4.22–5.81)
Retic Count, Absolute: 30.9 10*3/uL (ref 19.0–186.0)
Retic Ct Pct: 0.9 % (ref 0.4–3.1)

## 2020-02-16 LAB — VITAMIN B12: Vitamin B-12: 972 pg/mL — ABNORMAL HIGH (ref 180–914)

## 2020-02-16 LAB — IRON AND TIBC
Iron: 24 ug/dL — ABNORMAL LOW (ref 45–182)
Saturation Ratios: 13 % — ABNORMAL LOW (ref 17.9–39.5)
TIBC: 186 ug/dL — ABNORMAL LOW (ref 250–450)
UIBC: 162 ug/dL

## 2020-02-16 LAB — FERRITIN: Ferritin: 308 ng/mL (ref 24–336)

## 2020-02-16 LAB — FOLATE: Folate: 10.3 ng/mL (ref >5.9)

## 2020-02-16 MED ORDER — GLUCERNA SHAKE PO LIQD
237.0000 mL | Freq: Four times a day (QID) | ORAL | Status: DC
  Administered 2020-02-16 – 2020-02-22 (×3): 237 mL via ORAL

## 2020-02-16 MED ORDER — GLIPIZIDE ER 5 MG PO TB24
5.0000 mg | ORAL_TABLET | Freq: Every day | ORAL | Status: DC
  Administered 2020-02-17 – 2020-02-22 (×6): 5 mg via ORAL
  Filled 2020-02-16 (×6): qty 1

## 2020-02-16 MED ORDER — METFORMIN HCL ER 500 MG PO TB24
500.0000 mg | ORAL_TABLET | Freq: Every day | ORAL | Status: DC
  Administered 2020-02-16 – 2020-02-17 (×2): 500 mg via ORAL
  Filled 2020-02-16 (×2): qty 1

## 2020-02-16 NOTE — Progress Notes (Addendum)
Family Medicine Teaching Service Daily Progress Note Intern Pager: [phone]  Patient name: Steven Harvey Medical record number: [id number] Date of birth: 1966/11/12 Age: 54 y.o. Gender: male  Primary Care Provider: Patient, No Pcp Per Consultants: None Code Status: Full  Pt Overview and Major Events to Date:  2/27-admitted 2/28-transitioned off Endo tool 3/5- 120 mL PO intake (first time)  Assessment and Plan: Steven Harvey is a 54 y.o. male presenting with decreased PO intake, emesis, and hypothermia and COVID+. PMH is significant for T2DM, HTN, HLD, Alcoholism, and homelessness.   Intractable nausea suspected related to COVID infection, DKA- respiratory status stable, on RA. Patient endorses no nausea and he ate his full meal and lunch and dinner today. Diet fully advanced to carb modified. Overall patient feels much better. PT/OT recommend SNF. SW reports that pt will go to [place] when ready to discharge. Will clarify with SW today if able to go to [place] if still in quarantine window. - encouraged patient to continue to increase PO intake as tolerated - continue scopolamine patch and tigan - consult to dietitian for nutrition recommendations  - continue multivitamin and nutritional shakes - GI recommends OP workup including colon cancer screening -SW: dispo to [employer] [facility], clarify quarantine ability   RUE Thrombophlebitis- improved RUE doppler US performed 3/8 shows no e/o DVT, but superficial vein thrombosis in R basiphilic and cephalic veins. Likely from extravasated IVs last week. Feeling better today. - Acetaminophen for pain - Diclofenac gel - Warm compresses  Normocytic anemia- hgb 14.6 on admission (2/27) and has gradually declined throughout stay. Large component likely dilutional since patient received prolonged course of IV fluids while not able to tolerate PO. Suspect the new numbers are more accurate to patient's true level.  - will obtain anemia panel  and consider iron supplement.  - continue to monitor CBC  Prolonged QTc- 525 3/6 and stable -Avoid QTc prolonging agents -Continue cardiac monitoring -Replete electrolytes as needed   Hypokalemia  Hypernatremia- resolved  K+ 3.6, Na+ 14. Will hopefully stay stable with patient increasing PO intake. Discontinue IVF. -Daily BMP, Mg, phos  T2DM-stable, DKA resolved Blood sugars 180s-190s overnight, most recently 176. Because patient is not the best candidate for lantus outside the hospital due to living situation, started glipizide 5mg  yesterday. Will add metformin 500 mg today and increase as tolerated. -metformin 500 mg daily -start glipizide 5 mg -Daily BMP  AKI- resolved Creatinine 0.76 today. -Daily BMP  HTN- chronic, stable. 119/69 latest - Continue lisinopril 5 mg today  HLD- chronic -continue rosuvastatin 20mg  daily   EtOH abuse/homelessness  CIWA scores were trended and negative. CSW consulted and provided with resources.  - f/u CSW for OP placement   FEN/GI: carb modified Prophylaxis: Lovenox  Disposition: PT/OT recommending SNF, patient to go back to [place] pending ability to quarantine there   Subjective:  Patient feeling much better today  Objective: Temp:  [98 F (36.7 C)-99.9 F (37.7 C)] 98 F (36.7 C) (03/11 1328) Pulse Rate:  [81-82] 82 (03/11 1328) Resp:  [16-17] 16 (03/11 1328) BP: (119-146)/(69-94) 146/94 (03/11 1328) SpO2:  [100 %] 100 % (03/11 1328) Weight:  [53.8 kg] 53.8 kg (03/11 0505) Physical Exam: General: cachectic appearing older AA man, NAD, sitting up in bed Cardiovascular: rrr, no m/r/g Respiratory: CTAB, no increased WOB, no rales or rhonchi Abdomen: Scaphoid abdomen. Soft, NT, ND, normal bowel sounds present Extremities: Cachectic appearing extremities.  No edema. RUE same temperature as LUE, tender to palpation, no  swelling or erythema.  Laboratory: Recent Labs  Lab 02/14/20 0423 02/15/20 0411 02/16/20 0417   WBC 6.3 6.9 6.9  HGB 9.8* 9.2* 9.1*  HCT 30.8* 29.4* 29.3*  PLT 208 228 207   Recent Labs  Lab 02/10/20 0315 02/10/20 1800 02/11/20 0345 02/12/20 1513 02/14/20 0423 02/15/20 0411 02/16/20 0417  NA 143   < > 139   < > 140 145 141  K 3.9   < > 3.5   < > 3.4* 3.4* 3.6  CL 110   < > 105   < > 106 108 105  CO2 25   < > 25   < > 25 29 27   BUN 9   < > 6   < > <5* <5* <5*  CREATININE 0.81   < > 1.02   < > 0.93 0.74 0.76  CALCIUM 7.4*   < > 7.3*   < > 7.5* 7.7* 7.7*  PROT 4.4*  --  4.3*  --   --   --   --   BILITOT 0.7  --  0.6  --   --   --   --   ALKPHOS 66  --  69  --   --   --   --   ALT 10  --  11  --   --   --   --   AST 18  --  21  --   --   --   --   GLUCOSE 134*   < > 172*   < > 183* 103* 190*   < > = values in this interval not displayed.   Imaging/Diagnostic Tests: DG CHEST PORT 1 VIEW  Result Date: 02/16/2020 CLINICAL DATA:  Central venous catheter placement EXAM: PORTABLE CHEST 1 VIEW COMPARISON:  02/04/2020 FINDINGS: There is a left arm PICC line with tip at the distal SVC. Normal heart size. No pleural effusion or edema identified. Bilateral, patchy airspace densities are again noted and appear unchanged from previous examination. IMPRESSION: 1. Satisfactory position of left arm PICC line. 2. No change in bilateral airspace opacities. Electronically Signed   By: 02/06/2020 M.D.   On: 02/16/2020 09:40   VAS 04/17/2020 UPPER EXTREMITY VENOUS DUPLEX  Result Date: 02/13/2020 UPPER VENOUS STUDY  Indications: Pain, and Swelling Limitations: Patient position. Comparison Study: No prior study Performing Technologist: 04/14/2020 MHA, RDMS, RVT, RDCS  Examination Guidelines: A complete evaluation includes B-mode imaging, spectral Doppler, color Doppler, and power Doppler as needed of all accessible portions of each vessel. Bilateral testing is considered an integral part of a complete examination. Limited examinations for reoccurring indications may be performed as noted.  Right  Findings: +----------+------------+---------+-----------+----------+-------+ RIGHT     CompressiblePhasicitySpontaneousPropertiesSummary +----------+------------+---------+-----------+----------+-------+ IJV           Full       Yes       Yes                      +----------+------------+---------+-----------+----------+-------+ Subclavian    Full       Yes       Yes                      +----------+------------+---------+-----------+----------+-------+ Axillary      Full       Yes       Yes                      +----------+------------+---------+-----------+----------+-------+  Brachial      Full       Yes       Yes                      +----------+------------+---------+-----------+----------+-------+ Radial        Full                                          +----------+------------+---------+-----------+----------+-------+ Ulnar         Full                                          +----------+------------+---------+-----------+----------+-------+ Cephalic      None                                   Acute  +----------+------------+---------+-----------+----------+-------+ Basilic       None                                   Acute  +----------+------------+---------+-----------+----------+-------+  Left Findings: +----------+------------+---------+-----------+----------+--------------+ LEFT      CompressiblePhasicitySpontaneousProperties   Summary     +----------+------------+---------+-----------+----------+--------------+ Subclavian                                          Not visualized +----------+------------+---------+-----------+----------+--------------+  Summary:  Right: No evidence of deep vein thrombosis in the upper extremity. Findings consistent with acute superficial vein thrombosis involving the right basilic vein and right cephalic vein.  *See table(s) above for measurements and observations.  Diagnosing physician:  Fabienne Bruns MD Electronically signed by Fabienne Bruns MD on 02/13/2020 at 3:22:23 PM.    Final     Shirlean Mylar, MD 02/16/2020, 4:10 PM PGY-1, [facility] Family Medicine FPTS Intern pager: [phone], text pages welcome

## 2020-02-16 NOTE — Progress Notes (Signed)
Writer offered to call a family member with update, but patient refused. Patient alert and oriented x 4, he said he already talked with his family 

## 2020-02-16 NOTE — Progress Notes (Signed)
Reviewed documentation on PICC line noting repeated need for TPA of purple lumen. Red lumen continues to have blood return. Spoke with PICC RN for recommendations. Recommends discussing order for CXR to check placement. RN notified and states will talk to MD.

## 2020-02-16 NOTE — TOC Progression Note (Signed)
Transition of Care [facility]) - Progression Note    Patient Details  Name: Steven Harvey MRN: [id number] Date of Birth: 06-01-1966  Transition of Care [facility]) CM/SW Contact  Ranelle Oyster, Student-Social Work Phone Number: 02/16/2020, 9:48 AM  Clinical Narrative:    MSW finally spoke with pt via phone, due to COVID 19. MSW discussed role and reason for phone call. MSW initiated small assessment and asked pt about past employment, income, and his current living situation. MSW made pt aware that he would be returning to [facility] when he is ready to discharge. MSW asked pt if he needed anymore resources for substance use or housing. Pt said that [facility] was enough. MSW and CSW will continue to follow up as needed.         Expected Discharge Plan and Services                                                 Social Determinants of Health (SDOH) Interventions    Readmission Risk Interventions No flowsheet data found.

## 2020-02-16 NOTE — TOC Progression Note (Signed)
Transition of Care [facility]) - Progression Note    Patient Details  Name: Steven Harvey MRN: [id number] Date of Birth: Aug 21, 1966  Transition of Care [facility]) CM/SW Contact  Mearl Latin, LCSW Phone Number: 02/16/2020, 5:44 PM  Clinical Narrative:    CSW spoke with [facility] to see if patient would still be allowed to return to [facility] since his 21 day quarantine will be on 3/15. Debbie reported that patient was first positive on 2/20 and therefore his quarantine was over on 3/5 (as they use a 14 day isolation rule). However, [employer] is unable to accept patient if he is required any physical assistance or skilled care. CSW notes patient was requiring increased assistance with therapy today, therefore patient may not be able to return to [facility] until he is more physically capable.           Expected Discharge Plan and Services                                                 Social Determinants of Health (SDOH) Interventions    Readmission Risk Interventions No flowsheet data found.

## 2020-02-16 NOTE — Progress Notes (Signed)
Physical Therapy Treatment Patient Details Name: Steven Harvey MRN: [id number] DOB: [birth year] Today's Date: 02/16/2020    History of Present Illness 54 year old male with recent COVID-14 dx, admitted 02/04/20 fatigue, hypothermia and gastritis. Found to have DKA. CXR with likely PNA. PMH includes DM2, HTN, HLD.    PT Comments    Patient with improved mobility compared to last PT session but still with balance and gait deficits especially when challenged with ambulation in hallway. Patient is at risk for falls and is not at his baseline. Patient reports he feels 60% to his baseline level of mobility. PT would agree that patient exhibits approx 60-75% to his baseline level of mobility. Encouraged patient to sit OOB in chair. Patient declined due to dizziness on previous attempts. Encouraged HOB elevated in bed to mimic chair. Bed alarm on at end of session, call bell in reach. HOB approx 60 degrees. Continued recommendation for SNF for short term rehabilitation to progress patient back to independent level of mobility,    Follow Up Recommendations  SNF;Supervision/Assistance - 24 hour           Precautions / Restrictions Precautions Precautions: Fall;Other (comment) Restrictions Weight Bearing Restrictions: No    Mobility  Bed Mobility Overal bed mobility: Modified Independent Bed Mobility: Supine to Sit;Sit to Supine     Supine to sit: HOB elevated;Modified independent (Device/Increase time) Sit to supine: Modified independent (Device/Increase time);HOB elevated   General bed mobility comments: Encouraged patient to sit in chair but patient reports dizziness when he tries to sit in the chair. Encouraged HOB elevation to mimic chair position. Patient tolerated approx 60 degrees HOB at end of session.  Transfers Overall transfer level: Needs assistance Equipment used: None Transfers: Sit to/from Stand Sit to Stand: Supervision;Modified independent (Device/Increase time)    Ambulation/Gait Ambulation/Gait assistance: Supervision;Min guard Gait Distance (Feet): 20 Feet(150) Assistive device: None Gait Pattern/deviations: Scissoring;Narrow base of support(mild path deviations)     General Gait Details: More steady initially with short distance ambulation in room. Gait deficits more apparent with extended ambulation in hallways. Scissoring events approx 10% of gait trial, mild path deviations. Patient still not at baseline gait. He reports 60% to his baseline.       Balance Overall balance assessment: Needs assistance Sitting-balance support: Feet supported Sitting balance-Leahy Scale: Good     Standing balance support: No upper extremity supported Standing balance-Leahy Scale: Good Standing balance comment: Able to stand without support with distant supervision for toileting and hand hygiene tasks.      Cognition Arousal/Alertness: Awake/alert         General Comments General comments (skin integrity, edema, etc.): Patient no longer on tele monitor. No DOE noted.      Pertinent Vitals/Pain Pain Assessment: No/denies pain(No complaints of pain, no signs/symptoms)           PT Goals (current goals can now be found in the care plan section) Progress towards PT goals: Progressing toward goals    Frequency    Min 3X/week      PT Plan Current plan remains appropriate       AM-PAC PT "6 Clicks" Mobility   Outcome Measure  Help needed turning from your back to your side while in a flat bed without using bedrails?: None Help needed moving from lying on your back to sitting on the side of a flat bed without using bedrails?: None Help needed moving to and from a bed to a chair (including a wheelchair)?: A Little Help  needed standing up from a chair using your arms (e.g., wheelchair or bedside chair)?: None Help needed to walk in hospital room?: A Little Help needed climbing 3-5 steps with a railing? : A Little 6 Click Score: 21     End of Session   PT Visit Diagnosis: Unsteadiness on feet (R26.81);Other abnormalities of gait and mobility (R26.89);Difficulty in walking, not elsewhere classified (R26.2)     Time: 8138-8719 PT Time Calculation (min) (ACUTE ONLY): 17 min  Charges:  $Gait Training: 8-22 mins                     Angelene Giovanni, DPT, PT Acute Rehab [phone] office     Angelene Giovanni 02/16/2020, 2:00 PM

## 2020-02-17 DIAGNOSIS — Z95828 Presence of other vascular implants and grafts: Secondary | ICD-10-CM

## 2020-02-17 LAB — BASIC METABOLIC PANEL
Anion gap: 8 (ref 5–15)
BUN: 5 mg/dL — ABNORMAL LOW (ref 6–20)
CO2: 28 mmol/L (ref 22–32)
Calcium: 7.8 mg/dL — ABNORMAL LOW (ref 8.9–10.3)
Chloride: 103 mmol/L (ref 98–111)
Creatinine, Ser: 0.82 mg/dL (ref 0.61–1.24)
GFR calc Af Amer: >60 mL/min (ref >60)
GFR calc non Af Amer: >60 mL/min (ref >60)
Glucose, Bld: 202 mg/dL — ABNORMAL HIGH (ref 70–99)
Potassium: 3.8 mmol/L (ref 3.5–5.1)
Sodium: 139 mmol/L (ref 135–145)

## 2020-02-17 LAB — GLUCOSE, CAPILLARY
Glucose-Capillary: 132 mg/dL — ABNORMAL HIGH (ref 70–99)
Glucose-Capillary: 179 mg/dL — ABNORMAL HIGH (ref 70–99)
Glucose-Capillary: 185 mg/dL — ABNORMAL HIGH (ref 70–99)
Glucose-Capillary: 207 mg/dL — ABNORMAL HIGH (ref 70–99)
Glucose-Capillary: 171 mg/dL — ABNORMAL HIGH (ref 70–99)

## 2020-02-17 LAB — CBC
HCT: 29.4 % — ABNORMAL LOW (ref 39.0–52.0)
Hemoglobin: 9.1 g/dL — ABNORMAL LOW (ref 13.0–17.0)
MCH: 27.3 pg (ref 26.0–34.0)
MCHC: 31 g/dL (ref 30.0–36.0)
MCV: 88.3 fL (ref 80.0–100.0)
Platelets: 232 10*3/uL (ref 150–400)
RBC: 3.33 MIL/uL — ABNORMAL LOW (ref 4.22–5.81)
RDW: 12.7 % (ref 11.5–15.5)
WBC: 6.2 10*3/uL (ref 4.0–10.5)
nRBC: 0 % (ref 0.0–0.2)

## 2020-02-17 MED ORDER — METFORMIN HCL ER 500 MG PO TB24
1000.0000 mg | ORAL_TABLET | Freq: Every day | ORAL | Status: DC
  Administered 2020-02-18 – 2020-02-22 (×5): 1000 mg via ORAL
  Filled 2020-02-17 (×5): qty 2

## 2020-02-17 MED ORDER — FERROUS SULFATE 325 (65 FE) MG PO TABS
325.0000 mg | ORAL_TABLET | Freq: Every day | ORAL | Status: DC
  Administered 2020-02-18 – 2020-02-22 (×5): 325 mg via ORAL
  Filled 2020-02-17 (×5): qty 1

## 2020-02-17 MED ORDER — ROSUVASTATIN CALCIUM 20 MG PO TABS
20.0000 mg | ORAL_TABLET | Freq: Every day | ORAL | Status: DC
  Administered 2020-02-17 – 2020-02-21 (×5): 20 mg via ORAL
  Filled 2020-02-17 (×5): qty 1

## 2020-02-17 NOTE — Progress Notes (Signed)
Physical Therapy Treatment Patient Details Name: Steven Harvey MRN: [id number] DOB: May 18, 1966 Today's Date: 02/17/2020    History of Present Illness 54 year old male with recent COVID-87 dx, admitted 02/04/20 fatigue, hypothermia and gastritis. Found to have DKA. CXR with likely PNA. PMH includes DM2, HTN, HLD.    PT Comments    Patient progressing his mobility with skilled PT services in the hospital but he is still below baseline and is still at risk for falls. His balance reactions are delayed. He also demonstrates difficulty with higher level balance activities. Continued recommendation for SNF for short term rehabilitation until patient is independent with his mobility.     Follow Up Recommendations  SNF;Supervision/Assistance - 24 hour     Equipment Recommendations  None recommended by PT       Precautions / Restrictions Precautions Precautions: Fall;Other (comment)    Mobility  Bed Mobility Overal bed mobility: Modified Independent             General bed mobility comments: HOB elevated  Transfers Overall transfer level: Modified independent Equipment used: None Transfers: Sit to/from Stand Sit to Stand: Modified independent (Device/Increase time)         General transfer comment: sit<>stand from EOB x 2 trials  Ambulation/Gait Ambulation/Gait assistance: Min guard;Min assist Gait Distance (Feet): 150 Feet Assistive device: None Gait Pattern/deviations: Staggering right;Staggering left;Narrow base of support Gait velocity: improved but still decreased   General Gait Details: Mild path deviations. Education to use arms for balance as patient ambulates with hands clasped together in front of him. (Improved coordination and swing phase RLE but still impaired)   Stairs Stairs: (patient denies that there are any stairs at [employer])           Wheelchair Mobility    Modified Rankin (Stroke Patients Only)       Balance Overall balance  assessment: Needs assistance Sitting-balance support: No upper extremity supported Sitting balance-Leahy Scale: Good     Standing balance support: No upper extremity supported Standing balance-Leahy Scale: Good               High level balance activites: Other (comment)(alternating foot taps on target w/ & w/o visual feedback) High Level Balance Comments: present but delayed balance reactions requiring contact guard to minA(patient declined doing further activities)            Cognition Arousal/Alertness: Awake/alert Behavior During Therapy: WFL for tasks assessed/performed        General Comments General comments (skin integrity, edema, etc.): Balance and gait quality improving but still below baseline.      Pertinent Vitals/Pain Pain Assessment: No/denies pain(No complaints of pain, no signs/symptoms)           PT Goals (current goals can now be found in the care plan section) Progress towards PT goals: Progressing toward goals    Frequency    Min 3X/week      PT Plan Current plan remains appropriate       AM-PAC PT "6 Clicks" Mobility   Outcome Measure  Help needed turning from your back to your side while in a flat bed without using bedrails?: None Help needed moving from lying on your back to sitting on the side of a flat bed without using bedrails?: None Help needed moving to and from a bed to a chair (including a wheelchair)?: None Help needed standing up from a chair using your arms (e.g., wheelchair or bedside chair)?: None Help needed to walk in hospital room?:  A Little Help needed climbing 3-5 steps with a railing? : A Little 6 Click Score: 22    End of Session Equipment Utilized During Treatment: Gait belt Activity Tolerance: Patient tolerated treatment well Patient left: in bed;with call bell/phone within reach;with bed alarm set   PT Visit Diagnosis: Unsteadiness on feet (R26.81);Other abnormalities of gait and mobility  (R26.89);Difficulty in walking, not elsewhere classified (R26.2)     Time: 7588-3254 PT Time Calculation (min) (ACUTE ONLY): 12 min  Charges:  $Gait Training: 8-22 mins                     Angelene Giovanni, DPT, PT Acute Rehab [phone] office     Angelene Giovanni 02/17/2020, 3:58 PM

## 2020-02-17 NOTE — Progress Notes (Signed)
Family Medicine Teaching Service Daily Progress Note Intern Pager: [phone]  Patient name: Steven Harvey Medical record number: [id number] Date of birth: 1966/02/17 Age: 54 y.o. Gender: male  Primary Care Provider: Patient, No Pcp Per Consultants: None Code Status: Full  Pt Overview and Major Events to Date:  2/27-admitted 2/28-transitioned off Endo tool 3/5- 120 mL PO intake (first time)  Assessment and Plan: Steven Harvey is a 54 y.o. male presenting with decreased PO intake, emesis, and hypothermia and COVID+. PMH is significant for T2DM, HTN, HLD, Alcoholism, and homelessness.   Intractable nausea suspected related to COVID infection, DKA- respiratory status stable, on RA. Patient eating a regular diet (carb modified) without problem.  Overall patient feels much better. PT/OT recommend SNF. SW reports that pt will go to [facility] house when ready to discharge, but they cannot care for him if he needs more assistance. Difficult disposition due to no payer source. Will continue to assess functional status and SNF options as well as insurance. - encouraged patient to continue to increase PO intake as tolerated - can decrease scopolamine and tigan usage - consult to dietitian for nutrition recommendations  - continue multivitamin and nutritional shakes - GI recommends OP workup including colon cancer screening -SW: dispo to [employer], clarify quarantine ability   RUE Thrombophlebitis- improved RUE doppler US performed 3/8 shows no e/o DVT, but superficial vein thrombosis in R basiphilic and cephalic veins. Likely from extravasated IVs last week. Feeling better today. - Acetaminophen for pain - Diclofenac gel - Warm compresses  Normocytic anemia- hgb 14.6 on admission (2/27) and has gradually declined throughout stay. Large component likely dilutional since patient received prolonged course of IV fluids while not able to tolerate PO. Suspect the new numbers are more accurate to  patient's true level. Fe is low, MCV normal, otherwise anemia panel WNL. Ferritin elevated due to recent COVID-19 illness. - start iron supplement daily - continue to monitor CBC  Prolonged QTc- 525 3/6 and stable -Avoid QTc prolonging agents -Continue cardiac monitoring -Replete electrolytes as needed   Hypokalemia  Hypernatremia- resolved  K+ 3.8, Na+ 139. Will hopefully stay stable with patient increasing PO intake. Discontinue IVF. -Daily BMP, Mg, phos  T2DM-stable, DKA resolved Blood sugars 132-207 today. Because patient is not the best candidate for lantus outside the hospital due to living situation, started glipizide 5mg , and added metformin yesterday. Increase metformin to 1000mg  daily. -increase metformin 1000 mg daily -continue glipizide 5 mg -Daily BMP  AKI- resolved Creatinine 0.82 today. -Daily BMP  HTN- chronic, stable.138/84 latest - Continue lisinopril 5 mg today  HLD- chronic -continue rosuvastatin 20mg  daily   EtOH abuse/homelessness  CIWA scores were trended and negative. CSW consulted and provided with resources.  - f/u CSW for OP placement   FEN/GI: carb modified Prophylaxis: Lovenox  Disposition: PT/OT recommending SNF, patient to go back to [facility] house pending improved mobility  Subjective:  Patient feeling much better today  Objective: Temp:  [98 F (36.7 C)-98.5 F (36.9 C)] 98.3 F (36.8 C) (03/12 0519) Pulse Rate:  [78-91] 78 (03/12 0519) Resp:  [16-19] 17 (03/12 0519) BP: (110-146)/(75-94) 137/83 (03/12 0519) SpO2:  [100 %] 100 % (03/12 0519) Weight:  [56.1 kg] 56.1 kg (03/12 0322) Physical Exam: General: cachectic appearing older AA man, NAD, sitting up in bed Cardiovascular: rrr, no m/r/g Respiratory: CTAB, no increased WOB, no rales or rhonchi Abdomen: Scaphoid abdomen. Soft, NT, ND, normal bowel sounds present Extremities: Cachectic appearing extremities.  No edema. RUE same temperature  as LUE, tender to palpation, no  swelling or erythema.  Laboratory: Recent Labs  Lab 02/14/20 0423 02/15/20 0411 02/16/20 0417  WBC 6.3 6.9 6.9  HGB 9.8* 9.2* 9.1*  HCT 30.8* 29.4* 29.3*  PLT 208 228 207   Recent Labs  Lab 02/11/20 0345 02/12/20 1513 02/14/20 0423 02/15/20 0411 02/16/20 0417  NA 139   < > 140 145 141  K 3.5   < > 3.4* 3.4* 3.6  CL 105   < > 106 108 105  CO2 25   < > 25 29 27   BUN 6   < > <5* <5* <5*  CREATININE 1.02   < > 0.93 0.74 0.76  CALCIUM 7.3*   < > 7.5* 7.7* 7.7*  PROT 4.3*  --   --   --   --   BILITOT 0.6  --   --   --   --   ALKPHOS 69  --   --   --   --   ALT 11  --   --   --   --   AST 21  --   --   --   --   GLUCOSE 172*   < > 183* 103* 190*   < > = values in this interval not displayed.   Imaging/Diagnostic Tests: DG CHEST PORT 1 VIEW  Result Date: 02/16/2020 CLINICAL DATA:  Central venous catheter placement EXAM: PORTABLE CHEST 1 VIEW COMPARISON:  02/04/2020 FINDINGS: There is a left arm PICC line with tip at the distal SVC. Normal heart size. No pleural effusion or edema identified. Bilateral, patchy airspace densities are again noted and appear unchanged from previous examination. IMPRESSION: 1. Satisfactory position of left arm PICC line. 2. No change in bilateral airspace opacities. Electronically Signed   By: Kerby Moors M.D.   On: 02/16/2020 09:40   VAS [place] UPPER EXTREMITY VENOUS DUPLEX  Result Date: 02/13/2020 UPPER VENOUS STUDY  Indications: Pain, and Swelling Limitations: Patient position. Comparison Study: No prior study Performing Technologist: Maudry Mayhew MHA, RDMS, RVT, RDCS  Examination Guidelines: A complete evaluation includes B-mode imaging, spectral Doppler, color Doppler, and power Doppler as needed of all accessible portions of each vessel. Bilateral testing is considered an integral part of a complete examination. Limited examinations for reoccurring indications may be performed as noted.  Right Findings:  +----------+------------+---------+-----------+----------+-------+ RIGHT     CompressiblePhasicitySpontaneousPropertiesSummary +----------+------------+---------+-----------+----------+-------+ IJV           Full       Yes       Yes                      +----------+------------+---------+-----------+----------+-------+ Subclavian    Full       Yes       Yes                      +----------+------------+---------+-----------+----------+-------+ Axillary      Full       Yes       Yes                      +----------+------------+---------+-----------+----------+-------+ Brachial      Full       Yes       Yes                      +----------+------------+---------+-----------+----------+-------+ Radial        Full                                          +----------+------------+---------+-----------+----------+-------+  Ulnar         Full                                          +----------+------------+---------+-----------+----------+-------+ Cephalic      None                                   Acute  +----------+------------+---------+-----------+----------+-------+ Basilic       None                                   Acute  +----------+------------+---------+-----------+----------+-------+  Left Findings: +----------+------------+---------+-----------+----------+--------------+ LEFT      CompressiblePhasicitySpontaneousProperties   Summary     +----------+------------+---------+-----------+----------+--------------+ Subclavian                                          Not visualized +----------+------------+---------+-----------+----------+--------------+  Summary:  Right: No evidence of deep vein thrombosis in the upper extremity. Findings consistent with acute superficial vein thrombosis involving the right basilic vein and right cephalic vein.  *See table(s) above for measurements and observations.  Diagnosing physician: Fabienne Bruns  MD Electronically signed by Fabienne Bruns MD on 02/13/2020 at 3:22:23 PM.    Final     Shirlean Mylar, MD 02/17/2020, 6:46 AM PGY-1, [facility] Family Medicine FPTS Intern pager: [phone], text pages welcome

## 2020-02-17 NOTE — Progress Notes (Signed)
Writer offered to call patient's family with updates, but patient refused. He is alert and oriented and he verbalized that he already talked with his family. Will continue to monitor.

## 2020-02-17 NOTE — Progress Notes (Signed)
Patient able to get out of bed and use the bathroom. He refused to walk in the hallway, saying that he doesn't feel like walking right now. He is refusing to interact with staff. MD made aware. Will continue to monitor.

## 2020-02-17 NOTE — Progress Notes (Signed)
Nutrition Follow-up   RD working remotely.  DOCUMENTATION CODES:   Not applicable  INTERVENTION:  Continue Glucerna Shake po QID, each supplement provides 220 kcal and 10 grams of protein.  Discontinue Prostat.   Encourage adequate PO intake.   NUTRITION DIAGNOSIS:   Increased nutrient needs related to acute illness as evidenced by estimated needs; ongoing  GOAL:   Patient will meet greater than or equal to 90% of their needs; met  MONITOR:   PO intake, Supplement acceptance, Skin, Weight trends, Labs, I & O's  REASON FOR ASSESSMENT:   Consult Assessment of nutrition requirement/status  ASSESSMENT:   54 y.o. male presenting with decreased PO intake, emesis, and hypothermia. PMH is significant for recent COVID infection, T2DM, HTN, HLD, Alcoholism, and homelessness. Recently diagnosed with covid, which likely triggered his DKA.  Pt is currently on a carb modified diet with thin liquids. Meal completion has been 100%. Intake and appetite improved. Pt tolerating his diet PO. Nutritional supplements has been modified to Glucerna shakes per MD. Pt has been refusing his Prostat supplements. RD to discontinue Prostat and continue Glucerna shake to aid in adequate nutrition.   Labs and medications reviewed.   Diet Order:   Diet Order            Diet Carb Modified Fluid consistency: Thin; Room service appropriate? Yes  Diet effective now              EDUCATION NEEDS:   Not appropriate for education at this time  Skin:  Skin Assessment: Reviewed RN Assessment  Last BM:  3/10  Height:   Ht Readings from Last 1 Encounters:  02/04/20 '5\' 7"'$  (1.702 m)    Weight:   Wt Readings from Last 1 Encounters:  02/17/20 56.1 kg    Ideal Body Weight:  67.27 kg  BMI:  Body mass index is 19.37 kg/m.  Estimated Nutritional Needs:   Kcal:  1800-2000  Protein:  85-100 grams  Fluid:  >/= 1.8 L/day    Corrin Parker, MS, RD, LDN RD pager number/after hours weekend  pager number on Amion.

## 2020-02-17 NOTE — Progress Notes (Signed)
Occupational Therapy Treatment Patient Details Name: Yaron Grasse MRN: [id number] DOB: 01-08-1966 Today's Date: 02/17/2020    History of present illness 54 year old male with recent COVID-23 dx, admitted 02/04/20 fatigue, hypothermia and gastritis. Found to have DKA. CXR with likely PNA. PMH includes DM2, HTN, HLD.   OT comments  Pt with limited participation due to complaints of dizziness with mobility. BP sitting 131/95; standing 94/75. Pt primarily staying in bed with covers over his head. Encouraged pt to stay OOB for 2 hour periods at least 3x/day and walk with nurses. Pt reports he completed his bath at sink level earlier today. Will continue to follow acutely to facilitate DC back to [facility].   Follow Up Recommendations  SNF;Supervision/Assistance - 24 hour    Equipment Recommendations  Other (comment)    Recommendations for Other Services      Precautions / Restrictions Precautions Precautions: Fall Precaution Comments: orthostatic       Mobility Bed Mobility Overal bed mobility: Modified Independent             General bed mobility comments: HOB elevated  Transfers Overall transfer level: Needs assistance Equipment used: None Transfers: Sit to/from Stand;Stand Pivot Transfers Sit to Stand: Supervision         General transfer comment: sit<>stand from EOB x 2 trials    Balance Overall balance assessment: Needs assistance Sitting-balance support: No upper extremity supported Sitting balance-Leahy Scale: Good     Standing balance support: No upper extremity supported Standing balance-Leahy Scale: Good               High level balance activites: Other (comment)(alternating foot taps on target w/ & w/o visual feedback) High Level Balance Comments: present but delayed balance reactions requiring contact guard to minA(patient declined doing further activities)           ADL either performed or assessed with clinical judgement   ADL        Grooming: Supervision/safety;Standing   Upper Body Bathing: Supervision/ safety;Set up;Sitting   Lower Body Bathing: Supervison/ safety;Set up;Sit to/from stand   Upper Body Dressing : Set up;Sitting   Lower Body Dressing: Supervision/safety;Set up;Sit to/from stand   Toilet Transfer: Supervision/safety;Ambulation   Toileting- Clothing Manipulation and Hygiene: Supervision/safety;Sit to/from stand       Functional mobility during ADLs: Supervision/safety(limited today)       Vision       Perception     Praxis      Cognition Arousal/Alertness: Awake/alert Behavior During Therapy: Flat affect Overall Cognitive Status: No family/caregiver present to determine baseline cognitive functioning                                 General Comments: Pt states that he gets dizzy when he is up; decreased insight into understanding need for mobility to improve symptoms of dizziness        Exercises Exercises: Other exercises Other Exercises Other Exercises: encouraged bed level BUE adn LE exercises Other Exercises: encouraged chair position in bed and increase time OOB in chair   Shoulder Instructions       General Comments Balance and gait quality improving but still below baseline.    Pertinent Vitals/ Pain       Pain Assessment: No/denies pain  Home Living  Prior Functioning/Environment              Frequency  Min 2X/week        Progress Toward Goals  OT Goals(current goals can now be found in the care plan section)  Progress towards OT goals: Progressing toward goals  Acute Rehab OT Goals Patient Stated Goal: none stated OT Goal Formulation: With patient Time For Goal Achievement: 02/19/20 Potential to Achieve Goals: Good ADL Goals Pt Will Perform Grooming: with modified independence;standing Pt Will Perform Lower Body Bathing: with set-up;sit to/from stand Pt Will Perform  Upper Body Dressing: with modified independence;sitting Pt Will Perform Lower Body Dressing: with supervision;sitting/lateral leans Pt Will Transfer to Toilet: Independently;ambulating;regular height toilet Pt Will Perform Toileting - Clothing Manipulation and hygiene: Independently;sit to/from stand Pt/caregiver will Perform Home Exercise Program: Increased strength;Both right and left upper extremity;With theraband;Independently;With written HEP provided Additional ADL Goal #1: Pt will participate in OOB functional mobility assessment.  Plan Discharge plan remains appropriate    Co-evaluation                 AM-PAC OT "6 Clicks" Daily Activity     Outcome Measure   Help from another person eating meals?: None Help from another person taking care of personal grooming?: A Little Help from another person toileting, which includes using toliet, bedpan, or urinal?: A Little Help from another person bathing (including washing, rinsing, drying)?: A Little Help from another person to put on and taking off regular upper body clothing?: A Little Help from another person to put on and taking off regular lower body clothing?: A Little 6 Click Score: 19    End of Session    OT Visit Diagnosis: Muscle weakness (generalized) (M62.81);Other abnormalities of gait and mobility (R26.89)   Activity Tolerance Other (comment)(Pt limited himself wtih level of participation)   Patient Left in bed;with call bell/phone within reach;with bed alarm set;Other (comment)(chair position)   Nurse Communication Mobility status        Time: 5284-1324 OT Time Calculation (min): 19 min  Charges: OT General Charges $OT Visit: 1 Visit OT Treatments $Self Care/Home Management : 8-22 mins  Luisa Dago, OT/L   Acute OT Clinical Specialist Acute Rehabilitation Services Pager [phone] Office [phone]    [facility] 02/17/2020, 4:46 PM

## 2020-02-18 DIAGNOSIS — Z95828 Presence of other vascular implants and grafts: Secondary | ICD-10-CM

## 2020-02-18 LAB — CBC
HCT: 29.6 % — ABNORMAL LOW (ref 39.0–52.0)
Hemoglobin: 9.2 g/dL — ABNORMAL LOW (ref 13.0–17.0)
MCH: 28 pg (ref 26.0–34.0)
MCHC: 31.1 g/dL (ref 30.0–36.0)
MCV: 90 fL (ref 80.0–100.0)
Platelets: 228 10*3/uL (ref 150–400)
RBC: 3.29 MIL/uL — ABNORMAL LOW (ref 4.22–5.81)
RDW: 12.6 % (ref 11.5–15.5)
WBC: 6.9 10*3/uL (ref 4.0–10.5)
nRBC: 0 % (ref 0.0–0.2)

## 2020-02-18 LAB — GLUCOSE, CAPILLARY
Glucose-Capillary: 158 mg/dL — ABNORMAL HIGH (ref 70–99)
Glucose-Capillary: 173 mg/dL — ABNORMAL HIGH (ref 70–99)
Glucose-Capillary: 176 mg/dL — ABNORMAL HIGH (ref 70–99)
Glucose-Capillary: 202 mg/dL — ABNORMAL HIGH (ref 70–99)
Glucose-Capillary: 202 mg/dL — ABNORMAL HIGH (ref 70–99)
Glucose-Capillary: 238 mg/dL — ABNORMAL HIGH (ref 70–99)

## 2020-02-18 LAB — BASIC METABOLIC PANEL
Anion gap: 9 (ref 5–15)
BUN: <5 mg/dL — ABNORMAL LOW (ref 6–20)
CO2: 27 mmol/L (ref 22–32)
Calcium: 8 mg/dL — ABNORMAL LOW (ref 8.9–10.3)
Chloride: 104 mmol/L (ref 98–111)
Creatinine, Ser: 0.78 mg/dL (ref 0.61–1.24)
GFR calc Af Amer: >60 mL/min (ref >60)
GFR calc non Af Amer: >60 mL/min (ref >60)
Glucose, Bld: 180 mg/dL — ABNORMAL HIGH (ref 70–99)
Potassium: 3.6 mmol/L (ref 3.5–5.1)
Sodium: 140 mmol/L (ref 135–145)

## 2020-02-18 NOTE — Progress Notes (Addendum)
Family Medicine Teaching Service Daily Progress Note Intern Pager: [phone]  Patient name: Steven Harvey Medical record number: [id number] Date of birth: March 26, 1966 Age: 54 y.o. Gender: male  Primary Care Provider: Patient, No Pcp Per Consultants: None Code Status: Full  Pt Overview and Major Events to Date:  2/27-admitted 2/28-transitioned off Endo tool 3/5- 120 mL PO intake (first time)  Assessment and Plan: Steven Harvey is a 54 y.o. male presenting with decreased PO intake, emesis, and hypothermia and COVID+. PMH is significant for T2DM, HTN, HLD, Alcoholism, and homelessness.   Intractable nausea suspected related to COVID infection, DKA- stable and improved. Patient eating a regular diet (carb modified) without problem.  Overall patient feels much better. PT/OT recommend SNF. SW reports that pt will go to [facility] house when ready to discharge, but they cannot care for him if he needs more assistance. Difficult disposition due to no payer source. Will continue to assess functional status and SNF options as well as insurance. Patient recommended to get OOB multiple times a day and increase walking in hall with staff to improve mobility and functional status, pt refusing. Will discuss importance of improving functional status. Patient is finished with COVID-19 21 day quarantine today and can be transferred to regular floor. - encouraged patient to get OOB at least 3x/day and walking in hallways w/ staff - can decrease scopolamine and tigan usage - consult to dietitian for nutrition recommendations  - continue multivitamin and nutritional shakes - GI recommends OP workup including colon cancer screening -SW: dispo to [facility] house when mobility improves or SNF if bed available  RUE Thrombophlebitis- improved RUE doppler US performed 3/8 shows no e/o DVT, but superficial vein thrombosis in R basiphilic and cephalic veins. Likely from extravasated IVs last week. Feeling better today. -  Acetaminophen for pain - Diclofenac gel - Warm compresses  Normocytic anemia- hgb 14.6 on admission (2/27) and has gradually declined throughout stay, hgb now 9.2. Most likely dilutional since patient received prolonged course of IV fluids while not able to tolerate PO. Suspect the new numbers are more accurate to patient's true level. Fe is low, MCV normal, otherwise anemia panel WNL. Ferritin elevated due to recent COVID-19 illness. - start iron supplement daily  Prolonged QTc- 525 3/6 and stable -Avoid QTc prolonging agents -Continue cardiac monitoring -BMP monitoring qod   Hypokalemia  Hypernatremia- resolved  K+ 3.6, Na+ 140. Will hopefully stay stable with patient increasing PO intake. Discontinue IVF. -BMP qod  T2DM-stable, DKA resolved Blood sugars [phone] since yesterday. Because patient is not the best candidate for lantus outside the hospital due to living situation, started glipizide 5mg , and added metformin 1000mg  daily. -continue metformin 1000 mg daily, glipizide 5 mg -BMP qod  AKI- resolved Creatinine 0.78 today. -Daily BMP  HTN- chronic, stable. 113/80 latest - Continue lisinopril 5 mg   HLD- chronic -continue rosuvastatin 20mg  daily   EtOH abuse/homelessness  CIWA scores were trended and negative. CSW consulted and provided with resources.  - f/u CSW for OP placement   FEN/GI: carb modified Prophylaxis: Lovenox  Disposition: PT/OT recommending SNF, no bed offers as no payer source, patient to go back to [facility] house pending improved mobility  Subjective:  Patient feeling much better today. Discussed importance of working on mobility now that he is feeling better. Set goal of OOB x3 to chair today and walking around floor x3 with staff.  Objective: Temp:  [98.2 F (36.8 C)-98.6 F (37 C)] 98.6 F (37 C) (03/13 0524) Pulse Rate:  [  92-108] 92 (03/13 0524) Resp:  [18-19] 19 (03/13 0524) BP: (113-138)/(80-94) 113/80 (03/13 0524) SpO2:  [94  %-100 %] 99 % (03/13 0524) Physical Exam: General: cachectic appearing older AA man, NAD, laying in bed with blanket over head Cardiovascular: rrr, no m/r/g Respiratory: CTAB, no increased WOB, no rales or rhonchi Abdomen: Scaphoid abdomen. Soft, NT, ND, normal bowel sounds present Extremities: Cachectic appearing extremities.  No edema. RUE same temperature as LUE, tender to palpation, no swelling or erythema.  Laboratory: Recent Labs  Lab 02/16/20 0417 02/17/20 1010 02/18/20 0331  WBC 6.9 6.2 6.9  HGB 9.1* 9.1* 9.2*  HCT 29.3* 29.4* 29.6*  PLT 207 232 228   Recent Labs  Lab 02/16/20 0417 02/17/20 1010 02/18/20 0331  NA 141 139 140  K 3.6 3.8 3.6  CL 105 103 104  CO2 27 28 27   BUN <5* 5* <5*  CREATININE 0.76 0.82 0.78  CALCIUM 7.7* 7.8* 8.0*  GLUCOSE 190* 202* 180*   Imaging/Diagnostic Tests: DG CHEST PORT 1 VIEW  Result Date: 02/16/2020 CLINICAL DATA:  Central venous catheter placement EXAM: PORTABLE CHEST 1 VIEW COMPARISON:  02/04/2020 FINDINGS: There is a left arm PICC line with tip at the distal SVC. Normal heart size. No pleural effusion or edema identified. Bilateral, patchy airspace densities are again noted and appear unchanged from previous examination. IMPRESSION: 1. Satisfactory position of left arm PICC line. 2. No change in bilateral airspace opacities. Electronically Signed   By: Kerby Moors M.D.   On: 02/16/2020 09:40    Gladys Damme, MD 02/18/2020, 10:00 AM PGY-1, [place] Intern pager: [phone], text pages welcome

## 2020-02-19 DIAGNOSIS — I10 Essential (primary) hypertension: Secondary | ICD-10-CM

## 2020-02-19 DIAGNOSIS — E1165 Type 2 diabetes mellitus with hyperglycemia: Secondary | ICD-10-CM

## 2020-02-19 DIAGNOSIS — E1159 Type 2 diabetes mellitus with other circulatory complications: Secondary | ICD-10-CM

## 2020-02-19 LAB — GLUCOSE, CAPILLARY
Glucose-Capillary: 116 mg/dL — ABNORMAL HIGH (ref 70–99)
Glucose-Capillary: 119 mg/dL — ABNORMAL HIGH (ref 70–99)
Glucose-Capillary: 131 mg/dL — ABNORMAL HIGH (ref 70–99)
Glucose-Capillary: 140 mg/dL — ABNORMAL HIGH (ref 70–99)
Glucose-Capillary: 159 mg/dL — ABNORMAL HIGH (ref 70–99)
Glucose-Capillary: 163 mg/dL — ABNORMAL HIGH (ref 70–99)

## 2020-02-19 MED ORDER — INSULIN ASPART 100 UNIT/ML ~~LOC~~ SOLN
0.0000 [IU] | Freq: Three times a day (TID) | SUBCUTANEOUS | Status: DC
  Administered 2020-02-21 – 2020-02-22 (×4): 2 [IU] via SUBCUTANEOUS

## 2020-02-19 MED ORDER — INSULIN ASPART 100 UNIT/ML ~~LOC~~ SOLN: 0.0000 [IU] | Freq: Every day | SUBCUTANEOUS | Status: DC

## 2020-02-19 NOTE — Progress Notes (Signed)
Family Medicine Teaching Service Daily Progress Note Intern Pager: [phone]  Patient name: Steven Harvey Medical record number: [id number] Date of birth: [birth year] Age: 54 y.o. Gender: male  Primary Care Provider: Patient, No Pcp Per Consultants: None Code Status: Full  Pt Overview and Major Events to Date:  2/27-admitted 2/28-transitioned off Endo tool 3/5- 120 mL PO intake (first time)  Assessment and Plan: Jareb Radoncic is a 54 y.o. male presenting with decreased PO intake, emesis, and hypothermia and COVID+. PMH is significant for T2DM, HTN, HLD, Alcoholism, and homelessness.   Intractable nausea 2/2 COVID infection & DKA- stable.  Patient eating a regular diet (carb modified) without problem.  Overall patient feels well. PT/OT recommend SNF. SW reports that pt will go to [facility] house when ready to discharge. Will continue to assess functional status and SNF options as well as insurance. Patient recommended to get OOB multiple times a day and increase walking in hall with staff to improve mobility and functional status, pt refusing. Patient out of 21 day COVID quarantine period and may be transferred to non-covid unit.  - encouraged patient to get OOB at least 3x/day and walking in hallways w/ staff - can decrease scopolamine and tigan usage - dietitian following, recommending MVI and nutritional shakes - GI recommends OP workup including colon cancer screening -SW: dispo to [facility] house when mobility improves or SNF if bed available  RUE Thrombophlebitis- improved RUE doppler US performed 3/8 shows no DVT, but superficial vein thrombosis in R basiphilic and cephalic veins. Likely from extravasated IVs last week.  - Acetaminophen for pain - Diclofenac gel - Warm compresses or ice area prn  Normocytic anemia, stable: hgb 14.6 on admission (2/27) and has gradually declined throughout stay, hgb now 9.2. Fe is low, MCV normal, otherwise anemia panel WNL. Ferritin elevated due to  recent COVID-19 illness. - continue iron supplement daily  Prolonged QTc- 525 3/6 and stable -Avoid QTc prolonging agents -Continue cardiac monitoring -BMP monitoring qod   Hypokalemia  Hypernatremia- resolved  K+ 3.6, Na+ 140. Will hopefully stay stable with patient increasing PO intake. Discontinue IVF. -BMP qod  T2DM-stable, DKA resolved Blood sugars 131-202 since yesterday. Because patient is not the best candidate for lantus outside the hospital due to living situation, started glipizide 5mg , and added metformin 1000mg  daily. -continue metformin 1000 mg daily, glipizide 5 mg -BMP qod  AKI- resolved Creatinine 0.78 today. -Daily BMP  HTN- chronic, stable. 113/80 latest - Continue lisinopril 5 mg   HLD- chronic -continue rosuvastatin 20mg  daily   EtOH abuse/homelessness  CIWA scores were trended and negative. CSW consulted and provided with resources.  - f/u CSW for OP placement   FEN/GI: carb modified Prophylaxis: Lovenox  Disposition: PT/OT recommending SNF pending placement. Patient stable for discharge  Subjective:  Patient states that he is feeling well and is ready to go home.  He reports that he did get up and ambulate yesterday without issue of shortness of breath.  Objective: Temp:  [98.2 F (36.8 C)] 98.2 F (36.8 C) (03/13 2031) Pulse Rate:  [81] 81 (03/13 2031) BP: (131)/(88) 131/88 (03/13 2031) SpO2:  [100 %] 100 % (03/13 2031) Physical Exam: General: NAD, pleasant Cardiovascular: RRR, no m/r/g, no LE edema Respiratory: CTA BL, normal work of breathing Derm: no rashes appreciated Neuro: CN II-XII grossly intact Psych: AO, appropriate affect  Laboratory: Recent Labs  Lab 02/16/20 0417 02/17/20 1010 02/18/20 0331  WBC 6.9 6.2 6.9  HGB 9.1* 9.1* 9.2*  HCT 29.3* 29.4*  29.6*  PLT 207 232 228   Recent Labs  Lab 02/16/20 0417 02/17/20 1010 02/18/20 0331  NA 141 139 140  K 3.6 3.8 3.6  CL 105 103 104  CO2 27 28 27   BUN <5* 5* <5*   CREATININE 0.76 0.82 0.78  CALCIUM 7.7* 7.8* 8.0*  GLUCOSE 190* 202* 180*   Imaging/Diagnostic Tests: DG CHEST PORT 1 VIEW  Result Date: 02/16/2020 CLINICAL DATA:  Central venous catheter placement EXAM: PORTABLE CHEST 1 VIEW COMPARISON:  02/04/2020 FINDINGS: There is a left arm PICC line with tip at the distal SVC. Normal heart size. No pleural effusion or edema identified. Bilateral, patchy airspace densities are again noted and appear unchanged from previous examination. IMPRESSION: 1. Satisfactory position of left arm PICC line. 2. No change in bilateral airspace opacities. Electronically Signed   By: Kerby Moors M.D.   On: 02/16/2020 09:40    Kimarie Coor, [place], DO 02/19/2020, 7:57 AM PGY-3, [place] Intern pager: [phone], text pages welcome

## 2020-02-20 LAB — CBC
HCT: 30.1 % — ABNORMAL LOW (ref 39.0–52.0)
Hemoglobin: 9.4 g/dL — ABNORMAL LOW (ref 13.0–17.0)
MCH: 27.6 pg (ref 26.0–34.0)
MCHC: 31.2 g/dL (ref 30.0–36.0)
MCV: 88.5 fL (ref 80.0–100.0)
Platelets: 245 10*3/uL (ref 150–400)
RBC: 3.4 MIL/uL — ABNORMAL LOW (ref 4.22–5.81)
RDW: 12.8 % (ref 11.5–15.5)
WBC: 5.5 10*3/uL (ref 4.0–10.5)
nRBC: 0 % (ref 0.0–0.2)

## 2020-02-20 LAB — GLUCOSE, CAPILLARY
Glucose-Capillary: 90 mg/dL (ref 70–99)
Glucose-Capillary: 93 mg/dL (ref 70–99)
Glucose-Capillary: 115 mg/dL — ABNORMAL HIGH (ref 70–99)
Glucose-Capillary: 105 mg/dL — ABNORMAL HIGH (ref 70–99)

## 2020-02-20 LAB — BASIC METABOLIC PANEL
Anion gap: 13 (ref 5–15)
BUN: 7 mg/dL (ref 6–20)
CO2: 25 mmol/L (ref 22–32)
Calcium: 8.2 mg/dL — ABNORMAL LOW (ref 8.9–10.3)
Chloride: 103 mmol/L (ref 98–111)
Creatinine, Ser: 0.88 mg/dL (ref 0.61–1.24)
GFR calc Af Amer: >60 mL/min (ref >60)
GFR calc non Af Amer: >60 mL/min (ref >60)
Glucose, Bld: 104 mg/dL — ABNORMAL HIGH (ref 70–99)
Potassium: 3.8 mmol/L (ref 3.5–5.1)
Sodium: 141 mmol/L (ref 135–145)

## 2020-02-20 NOTE — Plan of Care (Signed)
  Problem: Education: Goal: Knowledge of General Education information will improve Description: Including pain rating scale, medication(s)/side effects and non-pharmacologic comfort measures Outcome: Not Progressing   Problem: Health Behavior/Discharge Planning: Goal: Ability to manage health-related needs will improve Outcome: Not Progressing   Problem: Clinical Measurements: Goal: Ability to maintain clinical measurements within normal limits will improve Outcome: Not Progressing Goal: Will remain free from infection Outcome: Not Progressing Goal: Diagnostic test results will improve Outcome: Not Progressing   Problem: Education: Goal: Knowledge of risk factors and measures for prevention of condition will improve Outcome: Not Progressing   Problem: Coping: Goal: Psychosocial and spiritual needs will be supported Outcome: Not Progressing   Problem: Respiratory: Goal: Will maintain a patent airway Outcome: Not Progressing Goal: Complications related to the disease process, condition or treatment will be avoided or minimized Outcome: Not Progressing   Problem: Education: Goal: Ability to describe self-care measures that may prevent or decrease complications (Diabetes Survival Skills Education) will improve Outcome: Not Progressing Goal: Individualized Educational Video(s) Outcome: Not Progressing   Problem: Cardiac: Goal: Ability to maintain an adequate cardiac output will improve Outcome: Not Progressing   Problem: Health Behavior/Discharge Planning: Goal: Ability to identify and utilize available resources and services will improve Outcome: Not Progressing Goal: Ability to manage health-related needs will improve Outcome: Not Progressing   Problem: Fluid Volume: Goal: Ability to achieve a balanced intake and output will improve Outcome: Not Progressing   Problem: Metabolic: Goal: Ability to maintain appropriate glucose levels will improve Outcome: Not  Progressing   Problem: Nutritional: Goal: Maintenance of adequate nutrition will improve Outcome: Not Progressing Goal: Maintenance of adequate weight for body size and type will improve Outcome: Not Progressing   Problem: Respiratory: Goal: Will regain and/or maintain adequate ventilation Outcome: Not Progressing   Problem: Urinary Elimination: Goal: Ability to achieve and maintain adequate renal perfusion and functioning will improve Outcome: Not Progressing

## 2020-02-20 NOTE — Progress Notes (Signed)
Family Medicine Teaching Service Daily Progress Note Intern Pager: [phone]  Patient name: Steven Harvey Medical record number: [id number] Date of birth: 03-26-1966 Age: 54 y.o. Gender: male  Primary Care Provider: Patient, No Pcp Per Consultants: None Code Status: Full  Pt Overview and Major Events to Date:  2/27-admitted 2/28-transitioned off Endo tool 3/5- 120 mL PO intake (first time)  Assessment and Plan: Javon Snee is a 54 y.o. male presenting with decreased PO intake, emesis, and hypothermia and COVID+. PMH is significant for T2DM, HTN, HLD, Alcoholism, and homelessness.   Intractable nausea 2/2 COVID infection & DKA- stable.  Patient eating a regular diet (carb modified) without problem.  Overall patient feels well. PT/OT recommend SNF. SW reports that pt will go to [place] when ready to discharge. Will continue to assess functional status and SNF options as well as insurance. Patient recommended to get OOB multiple times a day and increase walking in hall with staff to improve mobility and functional status, pt refusing.  Patient transferred out of Covid quarantine given that he is no longer in the quarantine window. - encouraged patient to get OOB at least 3x/day and walking in hallways w/ staff, patient reports that he will try - can decrease scopolamine and tigan usage - dietitian following, recommending MVI and nutritional shakes - GI recommends OP workup including colon cancer screening -SW: dispo to [place] when mobility improves or SNF if bed available  RUE Thrombophlebitis- improved RUE doppler US performed 3/8 shows no DVT, but superficial vein thrombosis in R basiphilic and cephalic veins. Likely from extravasated IVs last week.  - Acetaminophen for pain - Diclofenac gel - Warm compresses or ice area prn  Normocytic anemia, stable: hgb 14.6 on admission (2/27) and has gradually declined throughout stay, hgb now 9.2. Fe is low, MCV normal, otherwise  anemia panel WNL. Ferritin elevated due to recent COVID-19 illness. - continue iron supplement daily  Prolonged QTc- 525 3/6 and stable -Avoid QTc prolonging agents -Continue cardiac monitoring -BMP monitoring qod   Hypokalemia  Hypernatremia- resolved  K+ 3.6, Na+ 140. Will hopefully stay stable with patient increasing PO intake. Discontinue IVF. -BMP qod  T2DM-stable, DKA resolved Blood sugars 131-202 since yesterday. Because patient is not the best candidate for lantus outside the hospital due to living situation, started glipizide 5mg , and added metformin 1000mg  daily. -continue metformin 1000 mg daily, glipizide 5 mg -BMP qod  AKI- resolved Creatinine 0.78 today. -Daily BMP  HTN- chronic, stable. 113/80 latest - Continue lisinopril 5 mg   HLD- chronic -continue rosuvastatin 20mg  daily   EtOH abuse/homelessness  CIWA scores were trended and negative. CSW consulted and provided with resources.  - f/u CSW for OP placement   FEN/GI: carb modified Prophylaxis: Lovenox  Disposition: PT/OT recommending SNF pending placement.  Patient is stable for discharge  Subjective:  Patient sleeping when I enter the room.  Reports that his appetite has not improved.  We discussed his plans for discharge and patient says that he does not want to go back to where he came from because he will be at risk to catch Covid again.  We discussed that this is not likely given he is most likely immune at least for the next 3 months and by that time he will be eligible for the vaccine.  I encourage ambulation and eating.  Objective: Temp:  [97.9 F (36.6 C)-99.1 F (37.3 C)] 97.9 F (36.6 C) (03/15 0442) Pulse Rate:  [80-89] 82 (03/15 0442) Resp:  [16-18]  18 (03/15 0442) BP: (109-133)/(70-77) 133/77 (03/15 0442) SpO2:  [95 %-100 %] 95 % (03/15 0442) Weight:  [56.1 kg] 56.1 kg (03/14 2104) Physical Exam: General: NAD resting comfortably in bed Cardiovascular: Regular rate and rhythm, no  murmurs appreciated Respiratory: Normal work of breathing, clear to auscultation bilaterally Derm: no rashes appreciated Neuro: CN II-XII grossly intact Psych: AO, appropriate affect  Laboratory: Recent Labs  Lab 02/16/20 0417 02/17/20 1010 02/18/20 0331  WBC 6.9 6.2 6.9  HGB 9.1* 9.1* 9.2*  HCT 29.3* 29.4* 29.6*  PLT 207 232 228   Recent Labs  Lab 02/16/20 0417 02/17/20 1010 02/18/20 0331  NA 141 139 140  K 3.6 3.8 3.6  CL 105 103 104  CO2 27 28 27   BUN <5* 5* <5*  CREATININE 0.76 0.82 0.78  CALCIUM 7.7* 7.8* 8.0*  GLUCOSE 190* 202* 180*   Imaging/Diagnostic Tests: DG CHEST PORT 1 VIEW  Result Date: 02/16/2020 CLINICAL DATA:  Central venous catheter placement EXAM: PORTABLE CHEST 1 VIEW COMPARISON:  02/04/2020 FINDINGS: There is a left arm PICC line with tip at the distal SVC. Normal heart size. No pleural effusion or edema identified. Bilateral, patchy airspace densities are again noted and appear unchanged from previous examination. IMPRESSION: 1. Satisfactory position of left arm PICC line. 2. No change in bilateral airspace opacities. Electronically Signed   By: 02/06/2020 M.D.   On: 02/16/2020 09:40    04/17/2020, MD 02/20/2020, 5:59 AM PGY-1, [facility] Family Medicine FPTS Intern pager: [phone], text pages welcome

## 2020-02-20 NOTE — Progress Notes (Signed)
Occupational Therapy Treatment Patient Details Name: Steven Harvey MRN: [id number] DOB: [birth year] Today's Date: 02/20/2020    History of present illness 54 year old male with recent COVID-19 dx, admitted 02/04/20 fatigue, hypothermia and gastritis. Found to have DKA. CXR with likely PNA. PMH includes DM2, HTN, HLD.   OT comments  Pt in bed upon arrival. Pt did require encouragement to participate, but once OOB, pt did well, no c/o dizziness this session. Session focused on ADLs/selfcare and ALD mobility safety. OT will continue to follow acutely  Follow Up Recommendations  SNF;Supervision/Assistance - 24 hour    Equipment Recommendations  None recommended by OT    Recommendations for Other Services      Precautions / Restrictions Precautions Precautions: Fall Restrictions Weight Bearing Restrictions: No       Mobility Bed Mobility Overal bed mobility: Modified Independent Bed Mobility: Supine to Sit;Sit to Supine     Supine to sit: HOB elevated;Modified independent (Device/Increase time) Sit to supine: Modified independent (Device/Increase time);HOB elevated      Transfers Overall transfer level: Needs assistance Equipment used: None Transfers: Sit to/from [employer] Sit to Stand: Supervision Stand pivot transfers: Supervision            Balance Overall balance assessment: Needs assistance Sitting-balance support: No upper extremity supported Sitting balance-Leahy Scale: Good     Standing balance support: No upper extremity supported;During functional activity Standing balance-Leahy Scale: Good                             ADL either performed or assessed with clinical judgement   ADL Overall ADL's : Needs assistance/impaired     Grooming: Wash/dry hands;Wash/dry face;Supervision/safety;Standing   Upper Body Bathing: Set up;Sitting Upper Body Bathing Details (indicate cue type and reason): simulated Lower Body Bathing:  Supervison/ safety Lower Body Bathing Details (indicate cue type and reason): simulated Upper Body Dressing : Set up;Sitting   Lower Body Dressing: Supervision/safety;Sit to/from stand   Toilet Transfer: Supervision/safety;Ambulation;Regular Social worker and Hygiene: Supervision/safety       Functional mobility during ADLs: Supervision/safety General ADL Comments: Pt did require encouragement to participate, but once OOB, pt did well, no c/o dizziness     Vision Patient Visual Report: No change from baseline     Perception     Praxis      Cognition Arousal/Alertness: Awake/alert Behavior During Therapy: WFL for tasks assessed/performed Overall Cognitive Status: Within Functional Limits for tasks assessed                                 General Comments: no c/o of dizziness this session        Exercises     Shoulder Instructions       General Comments      Pertinent Vitals/ Pain       Pain Assessment: No/denies pain Faces Pain Scale: No hurt Pain Intervention(s): Monitored during session  Home Living                                          Prior Functioning/Environment              Frequency  Min 2X/week        Progress Toward Goals  OT Goals(current goals  can now be found in the care plan section)  Progress towards OT goals: Progressing toward goals     Plan Discharge plan remains appropriate    Co-evaluation                 AM-PAC OT "6 Clicks" Daily Activity     Outcome Measure   Help from another person eating meals?: None Help from another person taking care of personal grooming?: A Little Help from another person toileting, which includes using toliet, bedpan, or urinal?: A Little Help from another person bathing (including washing, rinsing, drying)?: A Little Help from another person to put on and taking off regular upper body clothing?: None Help from another  person to put on and taking off regular lower body clothing?: A Little 6 Click Score: 20    End of Session Equipment Utilized During Treatment: Gait belt  OT Visit Diagnosis: Muscle weakness (generalized) (M62.81);Other abnormalities of gait and mobility (R26.89)   Activity Tolerance Patient tolerated treatment well   Patient Left in bed;with call bell/phone within reach   Nurse Communication          Time: 8366-2947 OT Time Calculation (min): 24 min  Charges: OT General Charges $OT Visit: 1 Visit OT Treatments $Self Care/Home Management : 8-22 mins $Therapeutic Activity: 8-22 mins    [name] 02/20/2020, 2:51 PM

## 2020-02-20 NOTE — Progress Notes (Signed)
Physical Therapy Treatment Patient Details Name: Steven Harvey MRN: [id number] DOB: [birth year] Today's Date: 02/20/2020    History of Present Illness 54 year old male with recent COVID-25 dx, admitted 02/04/20 fatigue, hypothermia and gastritis. Found to have DKA. CXR with likely PNA. PMH includes DM2, HTN, HLD.    PT Comments    Pt was seen for mobility and noted O2 sats were controlled with 98-100%, pulses in 90-104 range with gait.  Pt has decreased light headed feeling after starting walk but did not completely clear.  Has had orthostatics checked but will redo on next session to maintain continuity of checking symptoms.  Pt walks on hall with min guard for safety, reaching for hall rails at times and requiring some direction for occasional listing to the side on the walk.  Reviewed safety with pt during the visit to ensure he does not try to get up alone.   Follow Up Recommendations  SNF     Equipment Recommendations  None recommended by PT    Recommendations for Other Services       Precautions / Restrictions Precautions Precautions: Fall Precaution Comments: monitor vitals Restrictions Weight Bearing Restrictions: No    Mobility  Bed Mobility Overal bed mobility: Modified Independent Bed Mobility: Supine to Sit;Sit to Supine     Supine to sit: HOB elevated;Modified independent (Device/Increase time) Sit to supine: Modified independent (Device/Increase time);HOB elevated      Transfers Overall transfer level: Needs assistance Equipment used: 1 person hand held assist Transfers: Sit to/from Stand Sit to Stand: Min guard Stand pivot transfers: Min guard       General transfer comment: min guard due to complaints of light headed feeling  Ambulation/Gait Ambulation/Gait assistance: Min guard Gait Distance (Feet): 100 Feet Assistive device: 1 person hand held assist Gait Pattern/deviations: Step-through pattern;Decreased stride length;Wide base of support Gait  velocity: decreased   General Gait Details: min guard to steady due to his complaints, pt reaching for wall rails   Stairs             Wheelchair Mobility    Modified Rankin (Stroke Patients Only)       Balance Overall balance assessment: Needs assistance Sitting-balance support: No upper extremity supported Sitting balance-Leahy Scale: Good     Standing balance support: No upper extremity supported;During functional activity Standing balance-Leahy Scale: Fair                              Cognition Arousal/Alertness: Awake/alert Behavior During Therapy: WFL for tasks assessed/performed Overall Cognitive Status: Within Functional Limits for tasks assessed                                 General Comments: light headed on entire walk      Exercises      General Comments General comments (skin integrity, edema, etc.): pt asked to sit down due to light headed feelings      Pertinent Vitals/Pain Pain Assessment: No/denies pain Faces Pain Scale: No hurt Pain Intervention(s): Monitored during session    Home Living                      Prior Function            PT Goals (current goals can now be found in the care plan section) Acute Rehab PT Goals Patient Stated Goal: none  stated Progress towards PT goals: Progressing toward goals    Frequency    Min 3X/week      PT Plan Current plan remains appropriate    Co-evaluation              AM-PAC PT "6 Clicks" Mobility   Outcome Measure  Help needed turning from your back to your side while in a flat bed without using bedrails?: None Help needed moving from lying on your back to sitting on the side of a flat bed without using bedrails?: A Little Help needed moving to and from a bed to a chair (including a wheelchair)?: A Little Help needed standing up from a chair using your arms (e.g., wheelchair or bedside chair)?: A Little Help needed to walk in hospital  room?: A Little Help needed climbing 3-5 steps with a railing? : A Little 6 Click Score: 19    End of Session Equipment Utilized During Treatment: Gait belt Activity Tolerance: Patient tolerated treatment well Patient left: in bed;with call bell/phone within reach;with bed alarm set Nurse Communication: Mobility status PT Visit Diagnosis: Unsteadiness on feet (R26.81);Other abnormalities of gait and mobility (R26.89);Difficulty in walking, not elsewhere classified (R26.2)     Time: 0086-7619 PT Time Calculation (min) (ACUTE ONLY): 16 min  Charges:  $Gait Training: 8-22 mins                    Ivar Drape 02/20/2020, 4:12 PM  Samul Dada, PT MS Acute Rehab Dept. Number: [facility] [id number] and [facility] [phone]

## 2020-02-21 LAB — GLUCOSE, CAPILLARY
Glucose-Capillary: 91 mg/dL (ref 70–99)
Glucose-Capillary: 153 mg/dL — ABNORMAL HIGH (ref 70–99)
Glucose-Capillary: 165 mg/dL — ABNORMAL HIGH (ref 70–99)
Glucose-Capillary: 144 mg/dL — ABNORMAL HIGH (ref 70–99)

## 2020-02-21 LAB — MRSA PCR SCREENING: MRSA by PCR: NEGATIVE

## 2020-02-21 NOTE — Progress Notes (Signed)
Pt walked today x1 with me.  He tolerated the walk pretty well.  Pt denied chest pain, shortness of breath, dizziness, lightheadedness, and n/v.  Patient then walked for a second time today with physical therapy.

## 2020-02-21 NOTE — Plan of Care (Signed)
  Problem: Respiratory: Goal: Will maintain a patent airway Outcome: Progressing Goal: Complications related to the disease process, condition or treatment will be avoided or minimized Outcome: Progressing   

## 2020-02-21 NOTE — Progress Notes (Signed)
Family Medicine Teaching Service Daily Progress Note Intern Pager: [phone]  Patient name: Steven Harvey Medical record number: [id number] Date of birth: 1966/01/15 Age: 54 y.o. Gender: male  Primary Care Provider: Patient, No Pcp Per Consultants: None Code Status: Full  Pt Overview and Major Events to Date:  2/27-admitted 2/28-transitioned off Endo tool 3/5- 120 mL PO intake (first time)  Assessment and Plan: Steven Harvey is a 54 y.o. male presenting with decreased PO intake, emesis, and hypothermia and COVID+. PMH is significant for T2DM, HTN, HLD, Alcoholism, and homelessness.   Intractable nausea 2/2 COVID infection & DKA- stable.  Patient has carb modified diet at this time.  Requests no starches in his diet because of his diabetes.  ET and OT of both recommended SNF placement for patient.  Alternatives from SW reports that pt will go to [place] when ready to discharge. Will continue to assess functional status and SNF options as well as insurance.  Discussed with patient that he needs to get out of bed multiple times a day increase walking in hall with staff to improve mobility and functional status, pt refusing.  Patient transferred out of Covid quarantine given that he is no longer in the quarantine window. -Patient encouraged to get out of bed at least 3 times a day and walk the hallways with staff. -Discontinue Tigan -Continue scopolamine at this time but plan on discontinuing in the near future - dietitian following, recommending MVI and nutritional shakes - GI recommends OP workup including colon cancer screening -SW: dispo to [place] when mobility improves or SNF if bed available  RUE Thrombophlebitis- improved RUE doppler US performed 3/8 shows no DVT, but superficial vein thrombosis in R basiphilic and cephalic veins. Likely from extravasated IVs last week.  - Acetaminophen for pain - Diclofenac gel - Warm compresses or ice area prn  Normocytic anemia,  stable: hgb 14.6 on admission (2/27) and has gradually declined throughout stay, hgb now 9.2. Fe is low, MCV normal, otherwise anemia panel WNL. Ferritin elevated due to recent COVID-19 illness. - continue iron supplement daily  Prolonged QTc- 525 3/6 and stable -Avoid QTc prolonging agents -Continue cardiac monitoring -BMP monitoring qod   Hypokalemia  Hypernatremia- resolved  K+ 3.6, Na+ 140. Will hopefully stay stable with patient increasing PO intake. Discontinue IVF. -BMP qod  T2DM-stable, DKA resolved Blood sugars 131-202 since yesterday. Because patient is not the best candidate for lantus outside the hospital due to living situation, started glipizide 5mg , and added metformin 1000mg  daily. -continue metformin 1000 mg daily, glipizide 5 mg -BMP qod  AKI- resolved Creatinine 0.78 today. -Daily BMP  HTN- chronic, stable. 113/80 latest - Continue lisinopril 5 mg   HLD- chronic -continue rosuvastatin 20mg  daily   EtOH abuse/homelessness  CIWA scores were trended and negative. CSW consulted and provided with resources.  - f/u CSW for OP placement   FEN/GI: carb modified Prophylaxis: Lovenox  Disposition: PT/OT recommending SNF pending placement.  Patient is stable for discharge  Subjective:  Patient is sleeping when I enter the room.  His only concern is that he has starches and has diet and he would prefer not to have them.  Discussed making sure patient gets up and gets out of bed today and walks with assistance.  Patient agrees to do so.  Objective: Temp:  [98.1 F (36.7 C)-98.8 F (37.1 C)] 98.5 F (36.9 C) (03/16 0508) Pulse Rate:  [77-81] 81 (03/16 0508) Resp:  [18] 18 (03/16 0508) BP: (123-139)/(72-81) 123/72 (03/16  8628) SpO2:  [98 %-100 %] 100 % (03/16 0508) Physical Exam: General: No acute distress, sleeping when I enter the room easily arouses Cardiovascular: Regular rate and rhythm no murmurs appreciated Respiratory: Clear to auscultation  bilaterally, normal breathing Derm: no rashes appreciated Neuro: CN II-XII grossly intact Psych: AO, appropriate affect  Laboratory: Recent Labs  Lab 02/17/20 1010 02/18/20 0331 02/20/20 0851  WBC 6.2 6.9 5.5  HGB 9.1* 9.2* 9.4*  HCT 29.4* 29.6* 30.1*  PLT 232 228 245   Recent Labs  Lab 02/17/20 1010 02/18/20 0331 02/20/20 0851  NA 139 140 141  K 3.8 3.6 3.8  CL 103 104 103  CO2 28 27 25   BUN 5* <5* 7  CREATININE 0.82 0.78 0.88  CALCIUM 7.8* 8.0* 8.2*  GLUCOSE 202* 180* 104*   Imaging/Diagnostic Tests: No results found.  , MD 02/21/2020, 5:54 AM PGY-1, [facility] Family Medicine FPTS Intern pager: [phone], text pages welcome

## 2020-02-21 NOTE — Progress Notes (Signed)
Physical Therapy Treatment Patient Details Name: Steven Harvey MRN: [id number] DOB: Oct 02, 1966 Today's Date: 02/21/2020    History of Present Illness 54 year old male with recent COVID-19 dx, admitted 02/04/20 fatigue, hypothermia and gastritis. Found to have DKA. CXR with likely PNA. PMH includes DM2, HTN, HLD, alcoholism, homelessness.    PT Comments    Pt up with RN upon arrival to room, and ambulated well with good tolerance today. Pt presenting with LE weakness, and pt states he feels weak vs baseline. Pt performed multiple LE exercises for strengthening and muscular endurance, and pt encouraged to perform bed-level exercises as tolerated independently. PT to continue to follow acutely.    Follow Up Recommendations  Other (comment)([facility])     Equipment Recommendations  None recommended by PT    Recommendations for Other Services       Precautions / Restrictions Precautions Precautions: Fall Precaution Comments: monitor vitals Restrictions Weight Bearing Restrictions: No    Mobility  Bed Mobility Overal bed mobility: Modified Independent         Sit to supine: Modified independent (Device/Increase time)   General bed mobility comments: pt OOB with RN upon PT arrival to room, Mod I for return to supine for increased time  Transfers Overall transfer level: Needs assistance   Transfers: Sit to/from Stand Sit to Stand: Supervision         General transfer comment: supervision for safety, repeated sit to stands for strengthening and pt requiring UE use intermittently to come to standing.  Ambulation/Gait Ambulation/Gait assistance: Min guard Gait Distance (Feet): 125 Feet Assistive device: None Gait Pattern/deviations: Step-through pattern;Decreased stride length;Wide base of support Gait velocity: decr   General Gait Details: Min guard for safety, varying BOS with preference for wide stance. Mild unsteadiness noted   Stairs              Wheelchair Mobility    Modified Rankin (Stroke Patients Only)       Balance Overall balance assessment: Needs assistance Sitting-balance support: No upper extremity supported Sitting balance-Leahy Scale: Good     Standing balance support: No upper extremity supported;During functional activity Standing balance-Leahy Scale: Good                              Cognition Arousal/Alertness: Awake/alert Behavior During Therapy: Flat affect Overall Cognitive Status: Within Functional Limits for tasks assessed                                 General Comments: short responses given to PT, not very conversant today      Exercises General Exercises - Lower Extremity Long Arc Quad: AROM;Both;15 reps;Seated Hip ABduction/ADduction: AAROM;Both;10 reps;Supine Straight Leg Raises: AROM;Both;15 reps;Supine Mini-Sqauts: AROM;Both;10 reps;Seated;Standing;Other (comment)(sit to stand from low bed height, intermittent UE use)    General Comments        Pertinent Vitals/Pain Pain Assessment: No/denies pain Pain Intervention(s): Limited activity within patient's tolerance;Monitored during session    Home Living                      Prior Function            PT Goals (current goals can now be found in the care plan section) Acute Rehab PT Goals Patient Stated Goal: none stated PT Goal Formulation: With patient Time For Goal Achievement: 02/28/20 Potential to Achieve Goals: Good  Progress towards PT goals: Progressing toward goals    Frequency    Min 3X/week      PT Plan Current plan remains appropriate    Co-evaluation              AM-PAC PT "6 Clicks" Mobility   Outcome Measure  Help needed turning from your back to your side while in a flat bed without using bedrails?: None Help needed moving from lying on your back to sitting on the side of a flat bed without using bedrails?: None Help needed moving to and from a bed to a  chair (including a wheelchair)?: None Help needed standing up from a chair using your arms (e.g., wheelchair or bedside chair)?: None Help needed to walk in hospital room?: A Little Help needed climbing 3-5 steps with a railing? : A Little 6 Click Score: 22    End of Session Equipment Utilized During Treatment: Gait belt Activity Tolerance: Patient tolerated treatment well Patient left: in bed;with call bell/phone within reach Nurse Communication: Mobility status PT Visit Diagnosis: Unsteadiness on feet (R26.81);Other abnormalities of gait and mobility (R26.89);Difficulty in walking, not elsewhere classified (R26.2)     Time: 3354-5625 PT Time Calculation (min) (ACUTE ONLY): 11 min  Charges:  $Therapeutic Exercise: 8-22 mins                     Jaielle Dlouhy E, PT Acute Rehabilitation Services Pager [phone]  Office [phone]   Everlie Eble D Elonda Husky 02/21/2020, 4:12 PM

## 2020-02-21 NOTE — Care Management (Signed)
Spoke w patient at bedside. Discussed why he cannot stay in bed in a dark room and sleep most of the day, declining to get up and out of bed to walk. Not only for DC planning but also general progression and the importance of ambulation while in the hospital. Discussed how [facility] staff has worked hard to get him a place secured at [facility] and how he needs to get up and get out of bed and walk, as this is required for transition there.  We made a verbal contract that he will get out of bed and walk twice a shift with a staff member. I discussed this with his bedside nurse, and the department AD. I placed a sign over his bed stating that he has agreed to get up and walk and requested staff to write in a progress note daily how much he walked and how he tolerated it.

## 2020-02-22 LAB — GLUCOSE, CAPILLARY
Glucose-Capillary: 157 mg/dL — ABNORMAL HIGH (ref 70–99)
Glucose-Capillary: 156 mg/dL — ABNORMAL HIGH (ref 70–99)

## 2020-02-22 MED ORDER — GLIPIZIDE ER 5 MG PO TB24: 5.0000 mg | ORAL_TABLET | Freq: Every day | ORAL | 0 refills | Status: DC

## 2020-02-22 MED ORDER — METFORMIN HCL 500 MG PO TABS: 500.0000 mg | ORAL_TABLET | Freq: Two times a day (BID) | ORAL | 0 refills | Status: DC

## 2020-02-22 MED ORDER — METFORMIN HCL ER (OSM) 1000 MG PO TB24: 1000.0000 mg | ORAL_TABLET | Freq: Every day | ORAL | 0 refills | Status: DC

## 2020-02-22 MED ORDER — BLOOD GLUCOSE MONITOR KIT: PACK | 0 refills | Status: AC

## 2020-02-22 MED FILL — 1671459001: 5 | 30 days supply | Qty: 30 | Fill #0

## 2020-02-22 MED FILL — metFORMIN HCL 500 MG TABS: 500 | 30 days supply | Qty: 60 | Fill #0

## 2020-02-22 NOTE — Progress Notes (Signed)
Pt has been D/C barriers to discharge are pt receiving medications from TOC. Pt will be transporting to [facility] house through [employer] [facility]. Debbie to follow up with Health Department about transportation from the hospital. [facility] pharmacy filling DC prescriptions with match letter and copay override. Patient will be given prescription for glucometer. Will continue to follow up on pt D/C

## 2020-02-22 NOTE — Progress Notes (Signed)
Pt walked around the whole unit approximately 300 ft, without device assistance. Pt tolerated well and returned to his room. Will continue to monitor.

## 2020-02-22 NOTE — Plan of Care (Signed)
  Problem: Health Behavior/Discharge Planning: Goal: Ability to manage health-related needs will improve Outcome: Progressing   

## 2020-02-22 NOTE — Progress Notes (Signed)
DISCHARGE NOTE HOME Steven Harvey to be discharged Home per MD order. Discussed prescriptions and follow up appointments with the patient. Prescriptions given to patient; medication list explained in detail. Patient verbalized understanding.  Skin clean, dry and intact without evidence of skin break down, no evidence of skin tears noted. IV catheter discontinued intact. Site without signs and symptoms of complications. Dressing and pressure applied. Pt denies pain at the site currently. No complaints noted.  Patient free of lines, drains, and wounds.   An After Visit Summary (AVS) was printed and given to the patient. Patient escorted via wheelchair, and discharged home via private auto.  Selina Cooley BSN, RN3

## 2020-02-22 NOTE — Plan of Care (Signed)
  Problem: Education: Goal: Knowledge of General Education information will improve Description: Including pain rating scale, medication(s)/side effects and non-pharmacologic comfort measures Outcome: Progressing   Problem: Health Behavior/Discharge Planning: Goal: Ability to manage health-related needs will improve Outcome: Progressing   Problem: Coping: Goal: Psychosocial and spiritual needs will be supported Outcome: Progressing   

## 2020-02-22 NOTE — TOC Transition Note (Signed)
Transition of Care [facility]) - CM/SW Discharge Note   Patient Details  Name: [facility] Stfleur MRN: [id number] Date of Birth: [birth year]  Transition of [facility]) CM/SW Contact:  Bess Kinds, RN Phone Number: [phone] 02/22/2020, 1:47 PM   Clinical Narrative:    Notified by nursing of DC order. NCM contacted Debbie at [employer]. Patient is able to return to [facility]. Debbie to follow up with Health Department about transportation from the hospital. [facility] pharmacy filling DC prescriptions with match letter and copay override. Patient will be given prescription for glucometer. Hospital follow up appointment scheduled at [facility] for 3/31 - appointment information has been placed on AVS. No further TOC needs identified.    Final next level of care: Home/Self Care(return to [facility]) Barriers to Discharge: No Barriers Identified   Patient Goals and CMS Choice Patient states their goals for this hospitalization and ongoing recovery are:: return to [email or web address].gov Compare Post Acute Care list provided to:: Patient Choice offered to / list presented to : NA  Discharge Placement                       Discharge Plan and Services                DME Arranged: N/A DME Agency: NA       HH Arranged: NA HH Agency: NA        Social Determinants of Health (SDOH) Interventions     Readmission Risk Interventions No flowsheet data found.

## 2020-02-22 NOTE — Progress Notes (Signed)
Occupational Therapy Treatment Patient Details Name: Steven Harvey MRN: [id number] DOB: 08/15/1966 Today's Date: 02/22/2020    History of present illness 54 year old male with recent COVID-19 dx, admitted 02/04/20 fatigue, hypothermia and gastritis. Found to have DKA. CXR with likely PNA. PMH includes DM2, HTN, HLD, alcoholism, homelessness.   OT comments  Pt making good progress with functional goals. Session focused on ADL mobility, toilet/shower transfers, ADLs and overall home safety. OT will continue to follow acutely  Follow Up Recommendations  No OT follow up    Equipment Recommendations  None recommended by OT    Recommendations for Other Services      Precautions / Restrictions Precautions Precautions: Fall Restrictions Weight Bearing Restrictions: No       Mobility Bed Mobility Overal bed mobility: Modified Independent                Transfers Overall transfer level: Needs assistance Equipment used: None Transfers: Sit to/from Stand Sit to Stand: Supervision Stand pivot transfers: Supervision       General transfer comment: supervision for safety    Balance Overall balance assessment: Needs assistance Sitting-balance support: No upper extremity supported       Standing balance support: No upper extremity supported;During functional activity Standing balance-Leahy Scale: Good                             ADL either performed or assessed with clinical judgement   ADL Overall ADL's : Needs assistance/impaired Eating/Feeding: Independent   Grooming: Wash/dry hands;Wash/dry face;Supervision/safety;Standing   Upper Body Bathing: Set up;Independent;Sitting   Lower Body Bathing: Supervison/ safety   Upper Body Dressing : Set up;Independent;Sitting   Lower Body Dressing: Supervision/safety;Sit to/from stand   Toilet Transfer: Supervision/safety;Ambulation;Regular Social worker and Hygiene:  Supervision/safety   Tub/ Engineer, structural: Supervision/safety;Ambulation;Grab bars   Functional mobility during ADLs: Supervision/safety       Vision Patient Visual Report: No change from baseline     Perception     Praxis      Cognition Arousal/Alertness: Awake/alert Behavior During Therapy: WFL for tasks assessed/performed Overall Cognitive Status: Within Functional Limits for tasks assessed                                          Exercises     Shoulder Instructions       General Comments      Pertinent Vitals/ Pain       Pain Assessment: No/denies pain Pain Score: 0-No pain Faces Pain Scale: No hurt Pain Intervention(s): Monitored during session  Home Living                                          Prior Functioning/Environment              Frequency  Min 2X/week        Progress Toward Goals  OT Goals(current goals can now be found in the care plan section)  Progress towards OT goals: Progressing toward goals     Plan Discharge plan needs to be updated    Co-evaluation                 AM-PAC OT "6 Clicks" Daily Activity  Outcome Measure   Help from another person eating meals?: None Help from another person taking care of personal grooming?: None Help from another person toileting, which includes using toliet, bedpan, or urinal?: A Little Help from another person bathing (including washing, rinsing, drying)?: A Little Help from another person to put on and taking off regular upper body clothing?: None Help from another person to put on and taking off regular lower body clothing?: A Little 6 Click Score: 21    End of Session    OT Visit Diagnosis: Muscle weakness (generalized) (M62.81);Other abnormalities of gait and mobility (R26.89)   Activity Tolerance Patient tolerated treatment well   Patient Left in bed;with call bell/phone within reach   Nurse Communication          Time:  1246-1310 OT Time Calculation (min): 24 min  Charges:       Steven Harvey 02/22/2020, 1:23 PM

## 2020-02-22 NOTE — Discharge Instructions (Signed)
Thank you for allowing [place] to participate in your care!    You were admitted for COVID-19 as well as DKA.  You were treated for the COVID-19 with remdesivir.  Your blood sugars were also managed and your DKA resolved.  After discharge we need to ensure that you are getting up and exercising.  With prolonged bedrest you became deconditioned which required physical therapy and Occupational Therapy evaluation and therapy.  They have now recommended that you are safe and strong enough for discharge but you need to continue exercising and avoid prolonged bedrest.  Regarding your glucoses you need to monitor your sugars and take your diabetes medications as prescribed.  You will need a hospital follow-up visit with your primary care provider.  You may not return to work until you are seen and evaluated and cleared by your primary care provider.  If you experience worsening of your admission symptoms, develop shortness of breath, life threatening emergency, suicidal or homicidal thoughts you must seek medical attention immediately by calling 911 or calling your MD immediately  if symptoms less severe.

## 2020-02-22 NOTE — Progress Notes (Signed)
NURSING PROGRESS NOTE  Steven Harvey [id number] Discharge Data: 02/22/2020 3:00 PM Attending Provider: Dickie La, MD [id number], No Pcp Per     Kerri Perches to be D/C'd [place] per MD order.  Discussed with the patient the After Visit Summary and all questions fully answered. All IV's discontinued with no bleeding noted. All belongings returned to patient for patient to take home.   Last Vital Signs:  Blood pressure (!) 144/81, pulse 73, temperature 98.7 F (37.1 C), temperature source Oral, resp. rate 16, height _0  (1.702 m), weight 56.1 kg, SpO2 98 %.  Discharge Medication List Allergies as of 02/22/2020   No Known Allergies     Medication List    STOP taking these medications   ibuprofen 400 MG tablet Commonly known as: ADVIL   naproxen 500 MG tablet Commonly known as: NAPROSYN     TAKE these medications   blood glucose meter kit and supplies Kit Dispense based on patient and insurance preference. Use up to four times daily as directed. (FOR ICD-9 250.00, 250.01). What changed: Another medication with the same name was added. Make sure you understand how and when to take each. Notes to patient: Follow MD directions    blood glucose meter kit and supplies Kit Dispense based on patient and insurance preference. Use up to four times daily as directed. (FOR ICD-9 250.00, 250.01). What changed: You were already taking a medication with the same name, and this prescription was added. Make sure you understand how and when to take each. Notes to patient: Follow MD instructions    glipiZIDE 5 MG 24 hr tablet Commonly known as: GLUCOTROL XL Take 1 tablet (5 mg total) by mouth daily with breakfast. Start taking on: February 23, 2020 Notes to patient: 02/23/2020   lisinopril 5 MG tablet Commonly known as: ZESTRIL Take 1 tablet (5 mg total) by mouth daily. Notes to patient: 02/23/2020   metFORMIN 500 MG tablet Commonly known as: Glucophage Take 1 tablet (500 mg total)  by mouth 2 (two) times daily with a meal. What changed:   medication strength  how much to take  when to take this Notes to patient: 02/22/2020   ondansetron 4 MG disintegrating tablet Commonly known as: Zofran ODT Take 1 tablet (4 mg total) by mouth every 8 (eight) hours as needed for nausea or vomiting.   simvastatin 20 MG tablet Commonly known as: ZOCOR Take 1 tablet (20 mg total) by mouth daily. Notes to patient: 02/23/2020   TYLENOL PO Take 1 tablet by mouth daily as needed (pain/fever/headache). What changed: Another medication with the same name was removed. Continue taking this medication, and follow the directions you see here.

## 2020-02-22 NOTE — Progress Notes (Signed)
Upon patient's request I called the patient's brother and informed him of the patient status as well as discussed note that patient will need because he has missed several court dates in [place] while hospitalized.  I informed the patient's brother that I was unable to email or text him a letter saying that the patient had been hospitalized but that he has being discharged today and he will have a letter in the discharge saying that he was hospitalized from February 04, 2020 to February 22, 2020.  The patient will be responsible for providing the court with that document.  Patient's brother had no other questions or concerns at that time.

## 2020-02-23 MED FILL — TRUEplus LANCETS 28G MISC: 25 days supply | Qty: 100 | Fill #0

## 2020-02-23 MED FILL — TRUE METRIX GLUCOSE TEST ST: 25 days supply | Qty: 100 | Fill #0

## 2020-02-23 MED FILL — TRUE METRIX BLOOD GLUCOSE M: W/DEVICE | 1 days supply | Qty: 1 | Fill #0

## 2020-03-05 NOTE — Progress Notes (Signed)
Patient ID: Steven Harvey, male   DOB: [birth year], 54 y.o.   MRN: [id number]   Steven Harvey, is a 54 y.o. male  SVX:[id number]  [id number]  DOB - Mar 22, 1966  Subjective:  Chief Complaint and HPI: Steven Harvey is a 54 y.o. male here today to establish care and for a follow up visit After hospitalization for DKA 19 2/27-3/17/2021.  Also had Covid 19 in February.  He has been sober and living at the [facility] since December.  He is only currently taking metformin 500 bid and his cholesterol meds.  Appetite is improving.  Feels great overall and getting stronger.  He has a glucometer.  No further hematemesis.  No CP/no SOB  From discharge summary: Indication for Hospitalization: COVID-19 with DKA  Discharge Diagnoses/Problem List:  Intractable nausea with Covid infection and DKA Right upper extremity thrombophlebitis Ascitic anemia Prolonged QTC Hypokalemia Type 2 diabetes AKI Hypertension Hyperlipidemia EtOH abuse/homelessness  Brief Hospital Course:  Steven Harvey a 54 y.o.malepresenting with decreased PO intake, emesis, and hypothermia. PMH is significant forrecent COVID infection, T2DM, HTN, HLD, Alcoholism, and homelessness.    DKA T2DM Was experiencing 8 days of emesis and decreased p.o. intake. In the ED,rectal temp 93.9.HL456.RR40.WBC 23.CXRw/patchy opacities within both lungs, indeterminate but suspicious for infection.EKG w/ sinus tach and prolonged QTc 523.LA 4.4. Lipase 271. VBG pH 7, pCO221, pO2 166, bicarb 5.9. Glu >700. Hgb 13.2. BHB acid >8. Hepatitis panel negative. He was started on insulin gtt per endo tool protocol and given 1L NS bolus x2. Anion gap closed x2 and was transitioned off endo tool but remained on dextrose containing fluids and n.p.o. due to poor mental status and concern for aspiration.  Patient continued to have nausea and epigastric pain, unable to tolerate p.o. for several days.  Diet was gradually increased and patient  eventually was able to tolerate a regular carb modified diet.  Patient identified as having protein calorie malnutrition.  Dietitian was consulted and recommended that patient receive Glucerna shakes 4 times daily and prostat.  When blood sugars normalized, patient started on oral regimen of glipizide 5 mg daily and Metformin 1000 mg daily.  Lantus was discontinued as patient was homeless, and would benefit from an oral regimen.  Was planning on discharging the patient on Metformin ER 1000 mg daily.  Due to national shortage and discussions with pharmacy this was unavailable.  Discharged patient on Metformin 500 mg twice daily.  Recommend switching to 1000 mg extended release daily when available given it will probably improve compliance as well as decrease GI side effects.  Hematemasis 2/27. Episode of vomiting, producing coffee-ground-appearing emesis. Lovenox was discontinued. Started 40 mg twice daily Protonix.  Hematemesis resolved and hemoglobin was stable throughout hospitalization.  Hemoglobin prior to discharge was 9.4.  COVID  SIRS Admitted to the [facility]. Presented w/ 4/4 SIRS criteria. Recently test positive for COVID 01/28/2020.Initial rectal temp 93.9.YB638. RR40. PCO2 21. WBC 23.CXRw/patchy opacities within both lungs, indeterminate but suspicious for infection.LA 4.4.S/p 1L NS bolus x2. Started on IV vancomycin and cefepime. Blood culture did not grow anything and antibiotics were discontinued. He was started on remdesivir 2/28.  Patient's respiratory status improved and he completed his remdesivir on 3/4.  Patient was Covid free and cleared to stepdown out of the Covid unit on 3/14.  Cachexia  malnutrition  BMI 19.73. albumin 3.2.  Admission patient had not been able to tolerate anything by mouth for 8 days, and was unable to tolerate anything by mouth for 6  days after admission due to nausea and epigastric pain.  Diet was gradually increased and patient eventually was able  to tolerate a regular carb modified diet.  Patient identified as having protein calorie malnutrition.  Dietitian was consulted and recommended that patient receive Glucerna shake 4 times daily at time of discharge.  Normocytic anemia-hgb 14.6 on admission (2/27) and has gradually declined throughout stay, hgb now 9.2.Mostlikely dilutional since patient received prolonged course of IV fluids while not able to tolerate PO. Suspect the new numbers are more accurate to patient's true level. Fe is low, MCV normal, otherwise anemia panel WNL. Ferritin elevated due to recent COVID-19 illness.  Daily iron supplement prescribed.  Last CBC prior to discharge showed hemoglobin of 9.4.  Patient should continue iron supplementation.  Prolonged QTc- 525 3/6 and stable. Avoid QTc prolonging agents.  Likely due to electrolyte abnormalities secondary to sepsis and DKA.  Recommend following to ensure resolution.    Issues for Follow Up:  1. Diabetes-recommend Metformin ER 1000 mg daily.  Unable to prescribe at discharge due to national shortage and uncertainty on availability for patient outpatient. 2. Hematemesis: Only 2 episodes, was not repeated in the hospital after initial episodes.  GI recommended outpatient colonoscopy.  3. Malnutrition: Dietitian recommendations 4. Anemia: Recommend intermittent monitoring, continue daily iron supplement 5. Prolonged QTC: Recommend intermittent monitoring to ensure resolution.    ED/Hospital notes reviewed.   Social History: recovering alcoholic-staying at [facility]:   Constitutional:  No f/c, No night sweats, No unexplained weight loss. EENT:  No vision changes, No blurry vision, No hearing changes. No mouth, throat, or ear problems.  Respiratory: No cough, No SOB Cardiac: No CP, no palpitations GI:  No abd pain, No N/V/D. GU: No Urinary s/sx Musculoskeletal: No joint pain Neuro: No headache, no dizziness, no motor weakness.  Skin: No  rash Endocrine:  No polydipsia. No polyuria.  Psych: Denies SI/HI  No problems updated.  ALLERGIES: No Known Allergies  PAST MEDICAL HISTORY: Past Medical History:  Diagnosis Date  . Diabetes mellitus without complication ([place])     MEDICATIONS AT HOME: Prior to Admission medications   Medication Sig Start Date End Date Taking? Authorizing Provider  Acetaminophen (TYLENOL PO) Take 1 tablet by mouth daily as needed (pain/fever/headache).   Yes [provider]  blood glucose meter kit and supplies KIT Dispense based on patient and insurance preference. Use up to four times daily as directed. (FOR ICD-9 250.00, 250.01). 12/05/19  Yes Raylene Everts, MD  blood glucose meter kit and supplies KIT Dispense based on patient and insurance preference. Use up to four times daily as directed. (FOR ICD-9 250.00, 250.01). 02/22/20  Yes Anderson, Chelsey L, DO  glipiZIDE (GLUCOTROL XL) 5 MG 24 hr tablet Take 1 tablet (5 mg total) by mouth daily with breakfast. 03/07/20 04/06/20 Yes Keyosha Tiedt, Dionne Bucy, PA-C  metFORMIN (GLUCOPHAGE) 500 MG tablet Take 2 tablets (1,000 mg total) by mouth 2 (two) times daily with a meal. 03/07/20 03/07/21 Yes Shail Urbas M, PA-C  lisinopril (ZESTRIL) 5 MG tablet Take 1 tablet (5 mg total) by mouth daily. 03/07/20   Argentina Donovan, PA-C  ondansetron (ZOFRAN ODT) 4 MG disintegrating tablet Take 1 tablet (4 mg total) by mouth every 8 (eight) hours as needed for nausea or vomiting. Patient not taking: Reported on 02/06/2020 01/28/20   Quintella Reichert, MD  simvastatin (ZOCOR) 20 MG tablet Take 1 tablet (20 mg total) by mouth daily. 03/07/20   Freeman Caldron  M, PA-C     Objective:  EXAM:   Vitals:   03/07/20 0936  BP: (!) 180/90  Pulse: 79  Weight: 135 lb 12.8 oz (61.6 kg)  Height: '5\' 7"'$  (1.702 m)    General appearance : A&OX3. NAD. Non-toxic-appearing; thin HEENT: Atraumatic and Normocephalic.  PERRLA. EOM intact.  Chest/Lungs:  Breathing-non-labored, Good  air entry bilaterally, breath sounds normal without rales, rhonchi, or wheezing  CVS: S1 S2 regular, no murmurs, gallops, rubs  Extremities: Bilateral Lower Ext shows no edema, both legs are warm to touch with = pulse throughout Neurology:  CN II-XII grossly intact, Non focal.   Psych:  TP linear. J/I WNL. Normal speech. Appropriate eye contact and affect.  Skin:  No Rash  Data Review Lab Results  Component Value Date   HGBA1C 13.2 (H) 02/04/2020     Assessment & Plan   1. Diabetic ketoacidosis without coma associated with type 2 diabetes mellitus ([place]) Uncontrolled.  Increase metformin and start glipizide as ordered on discharge.  Check blood sugars bid and record and bring to next visit - Glucose (CBG) - glipiZIDE (GLUCOTROL XL) 5 MG 24 hr tablet; Take 1 tablet (5 mg total) by mouth daily with breakfast.  Dispense: 30 tablet; Refill: 3 - metFORMIN (GLUCOPHAGE) 500 MG tablet; Take 2 tablets (1,000 mg total) by mouth 2 (two) times daily with a meal.  Dispense: 120 tablet; Refill: 3 - CBC with Differential/Platelet - Comprehensive metabolic panel  2. COVID-19 virus detected resolved  3. Hyperlipidemia, unspecified hyperlipidemia type continue - simvastatin (ZOCOR) 20 MG tablet; Take 1 tablet (20 mg total) by mouth daily.  Dispense: 30 tablet; Refill: 3  4. Essential hypertension Not controlled-resume - lisinopril (ZESTRIL) 5 MG tablet; Take 1 tablet (5 mg total) by mouth daily.  Dispense: 30 tablet; Refill: 3 - CBC with Differential/Platelet - Comprehensive metabolic panel     Patient have been counseled extensively about nutrition and exercise  Return for [facility] 3 weeks for DM and htn check and education and assign PCP in 3 months.  The patient was given clear instructions to go to ER or return to medical center if symptoms don't improve, worsen or new problems develop. The patient verbalized understanding. The patient was told to call to get lab results if they haven't  heard anything in the next week.     Freeman Caldron, PA-C [facility] [place], [place]   03/07/2020, 10:12 AM

## 2020-03-07 ENCOUNTER — Ambulatory Visit: Payer: Self-pay | Attending: Family Medicine | Admitting: Physician Assistant

## 2020-03-07 ENCOUNTER — Other Ambulatory Visit: Payer: Self-pay

## 2020-03-07 VITALS — BP 180/90 | HR 79 | Ht 67.0 in | Wt 135.8 lb

## 2020-03-07 DIAGNOSIS — U071 COVID-19: Secondary | ICD-10-CM

## 2020-03-07 DIAGNOSIS — E785 Hyperlipidemia, unspecified: Secondary | ICD-10-CM

## 2020-03-07 DIAGNOSIS — I10 Essential (primary) hypertension: Secondary | ICD-10-CM

## 2020-03-07 DIAGNOSIS — E1110 Type 2 diabetes mellitus with ketoacidosis without coma: Secondary | ICD-10-CM

## 2020-03-07 LAB — GLUCOSE, POCT (MANUAL RESULT ENTRY): POC Glucose: 264 mg/dl — AB (ref 70–99)

## 2020-03-07 MED ORDER — GLIPIZIDE ER 5 MG PO TB24: 5.0000 mg | ORAL_TABLET | Freq: Every day | ORAL | 3 refills | Status: DC

## 2020-03-07 MED ORDER — METFORMIN HCL 500 MG PO TABS: 1000.0000 mg | ORAL_TABLET | Freq: Two times a day (BID) | ORAL | 3 refills | Status: DC

## 2020-03-07 MED ORDER — SIMVASTATIN 20 MG PO TABS: 20.0000 mg | ORAL_TABLET | Freq: Every day | ORAL | 3 refills | Status: DC

## 2020-03-07 MED ORDER — LISINOPRIL 5 MG PO TABS: 5.0000 mg | ORAL_TABLET | Freq: Every day | ORAL | 3 refills | Status: DC

## 2020-03-07 MED FILL — LISINOPRIL 5 MG TABLET: 5 | 30 days supply | Qty: 30 | Fill #0

## 2020-03-07 MED FILL — glipiZIDE XL 5 MG TB24: 5 | 30 days supply | Qty: 30 | Fill #0

## 2020-03-07 MED FILL — SIMVASTATIN 20 MG TABLET: 20 | 30 days supply | Qty: 30 | Fill #0

## 2020-03-07 NOTE — Patient Instructions (Signed)
Check blood sugar fasting and at bedtime and record and bring to next visit.  Drink 8 glasses water daily

## 2020-03-08 LAB — CBC WITH DIFFERENTIAL/PLATELET
Basophils Absolute: 0 10*3/uL (ref 0.0–0.2)
Basos: 1 %
EOS (ABSOLUTE): 0.1 10*3/uL (ref 0.0–0.4)
Eos: 2 %
Hematocrit: 29.6 % — ABNORMAL LOW (ref 37.5–51.0)
Hemoglobin: 9.3 g/dL — ABNORMAL LOW (ref 13.0–17.7)
Immature Grans (Abs): 0 10*3/uL (ref 0.0–0.1)
Immature Granulocytes: 0 %
Lymphocytes Absolute: 1.5 10*3/uL (ref 0.7–3.1)
Lymphs: 26 %
MCH: 28 pg (ref 26.6–33.0)
MCHC: 31.4 g/dL — ABNORMAL LOW (ref 31.5–35.7)
MCV: 89 fL (ref 79–97)
Monocytes Absolute: 0.6 10*3/uL (ref 0.1–0.9)
Monocytes: 10 %
Neutrophils Absolute: 3.7 10*3/uL (ref 1.4–7.0)
Neutrophils: 61 %
Platelets: 217 10*3/uL (ref 150–450)
RBC: 3.32 x10E6/uL — ABNORMAL LOW (ref 4.14–5.80)
RDW: 13.6 % (ref 11.6–15.4)
WBC: 5.9 10*3/uL (ref 3.4–10.8)

## 2020-03-08 LAB — COMPREHENSIVE METABOLIC PANEL
ALT: 7 IU/L (ref 0–44)
AST: 13 IU/L (ref 0–40)
Albumin/Globulin Ratio: 1.6 (ref 1.2–2.2)
Albumin: 3.7 g/dL — ABNORMAL LOW (ref 3.8–4.9)
Alkaline Phosphatase: 81 IU/L (ref 39–117)
BUN/Creatinine Ratio: 15 (ref 9–20)
BUN: 14 mg/dL (ref 6–24)
Bilirubin Total: <0.2 mg/dL (ref 0.0–1.2)
CO2: 20 mmol/L (ref 20–29)
Calcium: 9.2 mg/dL (ref 8.7–10.2)
Chloride: 106 mmol/L (ref 96–106)
Creatinine, Ser: 0.91 mg/dL (ref 0.76–1.27)
GFR calc Af Amer: 110 mL/min/{1.73_m2} (ref >59)
GFR calc non Af Amer: 95 mL/min/{1.73_m2} (ref >59)
Globulin, Total: 2.3 g/dL (ref 1.5–4.5)
Glucose: 267 mg/dL — ABNORMAL HIGH (ref 65–99)
Potassium: 3.9 mmol/L (ref 3.5–5.2)
Sodium: 143 mmol/L (ref 134–144)
Total Protein: 6 g/dL (ref 6.0–8.5)

## 2020-03-19 MED FILL — metFORMIN HCL 500 MG TABS: 500 | 30 days supply | Qty: 120 | Fill #0

## 2020-03-28 ENCOUNTER — Encounter: Payer: Self-pay | Admitting: Pharmacist

## 2020-03-28 ENCOUNTER — Other Ambulatory Visit: Payer: Self-pay

## 2020-03-28 ENCOUNTER — Ambulatory Visit: Payer: Self-pay | Attending: Family Medicine | Admitting: Pharmacist

## 2020-03-28 DIAGNOSIS — E1140 Type 2 diabetes mellitus with diabetic neuropathy, unspecified: Secondary | ICD-10-CM

## 2020-03-28 NOTE — Patient Instructions (Signed)

## 2020-03-28 NOTE — Progress Notes (Signed)
    S:    PCP: not assigned   No chief complaint on file.  Patient arrives in good spirits.  Presents for diabetes evaluation, education, and management. Patient was referred on 03/07/2020 by Marylene Land.  Family/Social History:  - FHx: DM, heart failure  - Tobacco: former smoker - Alcohol: denies current use; resident at [facility] coverage/medication affordability: self pay  Patient reports adherence with medications.  Current diabetes medications include: metformin 1000 mg BID (two 500 mg tabs BID), glipizide 5 mg XL daily   Patient denies hypoglycemic events.  Patient reported dietary habits: - Limited to what the [facility] provides him - He admits to a high carb diet consisting of bread, pasta, rice and potatoes  - Denies eating sweets but admits to eating french toast this morning   Patient-reported exercise habits:  - Limited    Patient denies nocturia (nighttime urination).  Patient reports neuropathy (nerve pain). Patient reports visual changes. Patient reports self foot exams.     O:  POCT: 263  Lab Results  Component Value Date   HGBA1C 13.2 (H) 02/04/2020   There were no vitals filed for this visit.  Lipid Panel     Component Value Date/Time   CHOL 220 (H) 02/05/2020 0235   TRIG 281 (H) 02/05/2020 0235   HDL 40 (L) 02/05/2020 0235   CHOLHDL 5.5 02/05/2020 0235   VLDL 56 (H) 02/05/2020 0235   LDLCALC 124 (H) 02/05/2020 0235    Home fasting blood sugars: 170 - 308  2 hour post-meal/random blood sugars: 200 - 403.   Clinical Atherosclerotic Cardiovascular Disease (ASCVD): No  The 10-year ASCVD risk score Denman George DC Jr., et al., 2013) is: 36.1%   Values used to calculate the score:     Age: 54 years     Sex: Male     Is Non-Hispanic African American: Yes     Diabetic: Yes     Tobacco smoker: No     Systolic Blood Pressure: 180 mmHg     Is BP treated: Yes     HDL Cholesterol: 40 mg/dL     Total Cholesterol: 220 mg/dL     A/P: Diabetes longstanding currently uncontrolled. Patient is able to verbalize appropriate hypoglycemia management plan. Patient is adherent with medication. Control is suboptimal due to dietary indiscretion and phsyical inactivity.  Recommend to start 10 units qhs of Lantus. However, patient states he needs clearance from his manager at the [facility] to inject insulin. He will get this and return in 2 weeks. He requests a note stating his requirement for a low-carb, diabetic diet to give to the [facility]. I have provided this for him.   -Continue current regimen for now. -Extensively discussed pathophysiology of diabetes, recommended lifestyle interventions, dietary effects on blood sugar control -Counseled on s/sx of and management of hypoglycemia -Next A1C anticipated 04/2020.   Written patient instructions provided.  Total time in face to face counseling 30 minutes.   Follow up Pharmacist Clinic Visit in 2 weeks.  Butch Penny, PharmD, CPP Clinical Pharmacist [facility] [phone]

## 2020-04-09 MED FILL — SIMVASTATIN 20 MG TABLET: 20 | 30 days supply | Qty: 30 | Fill #1

## 2020-04-09 MED FILL — LISINOPRIL 5 MG TABLET: 5 | 30 days supply | Qty: 30 | Fill #1

## 2020-04-09 MED FILL — glipiZIDE XL 5 MG TB24: 5 | 30 days supply | Qty: 30 | Fill #1

## 2020-04-11 ENCOUNTER — Ambulatory Visit: Payer: Self-pay | Admitting: Pharmacist

## 2020-04-19 ENCOUNTER — Other Ambulatory Visit: Payer: Self-pay

## 2020-04-19 ENCOUNTER — Ambulatory Visit: Payer: Self-pay | Attending: Family Medicine | Admitting: Pharmacist

## 2020-04-19 DIAGNOSIS — E1140 Type 2 diabetes mellitus with diabetic neuropathy, unspecified: Secondary | ICD-10-CM

## 2020-04-19 DIAGNOSIS — Z794 Long term (current) use of insulin: Secondary | ICD-10-CM

## 2020-04-19 MED ORDER — BASAGLAR KWIKPEN 100 UNIT/ML ~~LOC~~ SOPN: 10.0000 [IU] | PEN_INJECTOR | Freq: Every day | SUBCUTANEOUS | 2 refills | Status: DC

## 2020-04-19 MED ORDER — TRUEPLUS PEN NEEDLES 32G X 4 MM MISC: 2 refills | Status: AC

## 2020-04-19 NOTE — Progress Notes (Signed)
    S:    PCP: not assigned   No chief complaint on file.  Patient arrives in good spirits.  Presents for diabetes evaluation, education, and management. Patient was referred on 03/07/2020 by Marylene Land. I last saw him on 03/28/2020 and provided education.   Family/Social History:  - FHx: DM, heart failure  - Tobacco: former smoker - Alcohol: denies current use; resident at [facility] coverage/medication affordability: self pay  Patient reports adherence with medications.  Current diabetes medications include: metformin 1000 mg BID (two 500 mg tabs BID), glipizide 5 mg XL daily   Patient denies hypoglycemic events.  Patient reported dietary habits: - Limited to what the [facility] provides him - He admits to a high carb diet consisting of bread, pasta, rice and potatoes  - Denies eating sweets but admits to eating french toast this morning   Patient-reported exercise habits:  - Limited    Patient denies nocturia (nighttime urination).  Patient reports neuropathy (nerve pain). Patient reports visual changes. Patient reports self foot exams.     O:  POCT: 201  Lab Results  Component Value Date   HGBA1C 13.2 (H) 02/04/2020   There were no vitals filed for this visit.  Lipid Panel     Component Value Date/Time   CHOL 220 (H) 02/05/2020 0235   TRIG 281 (H) 02/05/2020 0235   HDL 40 (L) 02/05/2020 0235   CHOLHDL 5.5 02/05/2020 0235   VLDL 56 (H) 02/05/2020 0235   LDLCALC 124 (H) 02/05/2020 0235   Home fasting blood sugars: 170 - 308  2 hour post-meal/random blood sugars: 200 - 403.  Clinical Atherosclerotic Cardiovascular Disease (ASCVD): No  The 10-year ASCVD risk score Denman George DC Jr., et al., 2013) is: 36.1%   Values used to calculate the score:     Age: 54 years     Sex: Male     Is Non-Hispanic African American: Yes     Diabetic: Yes     Tobacco smoker: No     Systolic Blood Pressure: 180 mmHg     Is BP treated: Yes     HDL Cholesterol: 40  mg/dL     Total Cholesterol: 220 mg/dL    A/P: Diabetes longstanding currently uncontrolled. Patient is able to verbalize appropriate hypoglycemia management plan. Patient is adherent with medication. Control is suboptimal due to dietary indiscretion and phsyical inactivity. Will start Basaglar today. Pt is due for an A1c; will defer to PCP.  -Start Basaglar 10 units daily. Counseling given. -Continue current regimen for now. -Extensively discussed pathophysiology of diabetes, recommended lifestyle interventions, dietary effects on blood sugar control -Counseled on s/sx of and management of hypoglycemia -A1c due; will defer to PCP.   Written patient instructions provided.  Total time in face to face counseling 30 minutes.   Follow up w/ PCP.  Butch Penny, PharmD, CPP Clinical Pharmacist [facility] [phone]

## 2020-04-19 NOTE — Patient Instructions (Signed)
Thank you for coming to see me today. Please do the following:  1. Start Hospital doctor. Inject 10 units before bedtime every night.  2. Continue other medications.  3. Continue checking blood sugars at home. Your morning sugars should run between 80 - 130.  4. Continue making the lifestyle changes we've discussed together during our visit. Diet and exercise play a significant role in improving your blood sugars.  5. Follow-up with Dr. Alvis Lemmings in June.    Hypoglycemia or low blood sugar:   Low blood sugar can happen quickly and may become an emergency if not treated right away.   While this shouldn't happen often, it can be brought upon if you skip a meal or do not eat enough. Also, if your insulin or other diabetes medications are dosed too high, this can cause your blood sugar to go to low.   Warning signs of low blood sugar include: 1. Feeling shaky or dizzy 2. Feeling weak or tired  3. Excessive hunger 4. Feeling anxious or upset  5. Sweating even when you aren't exercising  What to do if I experience low blood sugar? 1. Check your blood sugar with your meter. If lower than 70, proceed to step 2.  2. Treat with 3-4 glucose tablets or 3 packets of regular sugar. If these aren't around, you can try hard candy. Yet another option would be to drink 4 ounces of fruit juice or 6 ounces of REGULAR soda.  3. Re-check your sugar in 15 minutes. If it is still below 70, do what you did in step 2 again. If has come back up, go ahead and eat a snack or small meal at this time.

## 2020-04-20 ENCOUNTER — Encounter: Payer: Self-pay | Admitting: Pharmacist

## 2020-04-23 MED FILL — METFORMIN HCL 500 MG TABS: 500 | 30 days supply | Qty: 120 | Fill #1

## 2020-05-14 MED FILL — ?BASAGLAR 100 UNITS/ML KWPE: 100 | 30 days supply | Qty: 3 | Fill #1

## 2020-05-14 MED FILL — ?SIMVASTATIN 20MG TABLE: 20 | 30 days supply | Qty: 30 | Fill #2

## 2020-05-22 ENCOUNTER — Other Ambulatory Visit: Payer: Self-pay

## 2020-05-22 ENCOUNTER — Emergency Department (HOSPITAL_COMMUNITY)
Admission: EM | Admit: 2020-05-22 | Discharge: 2020-05-22 | Disposition: A | Discharge status: Home / Self Care | Payer: Self-pay | Attending: Emergency Medicine | Admitting: Emergency Medicine

## 2020-05-22 ENCOUNTER — Encounter (HOSPITAL_COMMUNITY): Payer: Self-pay | Admitting: Emergency Medicine

## 2020-05-22 ENCOUNTER — Emergency Department (HOSPITAL_COMMUNITY): Payer: Self-pay

## 2020-05-22 DIAGNOSIS — Z794 Long term (current) use of insulin: Secondary | ICD-10-CM | POA: Insufficient documentation

## 2020-05-22 DIAGNOSIS — R52 Pain, unspecified: Secondary | ICD-10-CM

## 2020-05-22 DIAGNOSIS — E1140 Type 2 diabetes mellitus with diabetic neuropathy, unspecified: Secondary | ICD-10-CM | POA: Insufficient documentation

## 2020-05-22 DIAGNOSIS — Z79899 Other long term (current) drug therapy: Secondary | ICD-10-CM | POA: Insufficient documentation

## 2020-05-22 DIAGNOSIS — Z87891 Personal history of nicotine dependence: Secondary | ICD-10-CM | POA: Insufficient documentation

## 2020-05-22 DIAGNOSIS — R531 Weakness: Secondary | ICD-10-CM

## 2020-05-22 DIAGNOSIS — I10 Essential (primary) hypertension: Secondary | ICD-10-CM | POA: Insufficient documentation

## 2020-05-22 DIAGNOSIS — E119 Type 2 diabetes mellitus without complications: Secondary | ICD-10-CM | POA: Insufficient documentation

## 2020-05-22 HISTORY — DX: Other psychoactive substance abuse, uncomplicated: F19.10

## 2020-05-22 LAB — BASIC METABOLIC PANEL
Anion gap: 11 (ref 5–15)
BUN: 15 mg/dL (ref 6–20)
CO2: 23 mmol/L (ref 22–32)
Calcium: 9.2 mg/dL (ref 8.9–10.3)
Chloride: 107 mmol/L (ref 98–111)
Creatinine, Ser: 0.99 mg/dL (ref 0.61–1.24)
GFR calc Af Amer: >60 mL/min (ref >60)
GFR calc non Af Amer: >60 mL/min (ref >60)
Glucose, Bld: 107 mg/dL — ABNORMAL HIGH (ref 70–99)
Potassium: 3.7 mmol/L (ref 3.5–5.1)
Sodium: 141 mmol/L (ref 135–145)

## 2020-05-22 LAB — CBC
HCT: 32.4 % — ABNORMAL LOW (ref 39.0–52.0)
Hemoglobin: 10.6 g/dL — ABNORMAL LOW (ref 13.0–17.0)
MCH: 28.7 pg (ref 26.0–34.0)
MCHC: 32.7 g/dL (ref 30.0–36.0)
MCV: 87.8 fL (ref 80.0–100.0)
Platelets: 197 10*3/uL (ref 150–400)
RBC: 3.69 MIL/uL — ABNORMAL LOW (ref 4.22–5.81)
RDW: 12.9 % (ref 11.5–15.5)
WBC: 6.3 10*3/uL (ref 4.0–10.5)
nRBC: 0 % (ref 0.0–0.2)

## 2020-05-22 LAB — TROPONIN I (HIGH SENSITIVITY)
Troponin I (High Sensitivity): 28 ng/L — ABNORMAL HIGH (ref <18)
Troponin I (High Sensitivity): 28 ng/L — ABNORMAL HIGH (ref <18)

## 2020-05-22 MED ORDER — SODIUM CHLORIDE 0.9% FLUSH: 3.0000 mL | Freq: Once | INTRAVENOUS | Status: DC

## 2020-05-22 MED ORDER — KETOROLAC TROMETHAMINE 30 MG/ML IJ SOLN
15.0000 mg | Freq: Once | INTRAMUSCULAR | Status: AC
  Administered 2020-05-22: 15 mg via INTRAVENOUS
  Filled 2020-05-22: qty 1

## 2020-05-22 MED ORDER — ONDANSETRON HCL 4 MG/2ML IJ SOLN
4.0000 mg | Freq: Once | INTRAMUSCULAR | Status: AC
  Administered 2020-05-22: 4 mg via INTRAVENOUS
  Filled 2020-05-22: qty 2

## 2020-05-22 MED ORDER — SODIUM CHLORIDE 0.9 % IV BOLUS
1000.0000 mL | Freq: Once | INTRAVENOUS | Status: AC
  Administered 2020-05-22: 1000 mL via INTRAVENOUS

## 2020-05-22 MED ORDER — ONDANSETRON 4 MG PO TBDP: 4.0000 mg | ORAL_TABLET | Freq: Three times a day (TID) | ORAL | 0 refills | Status: AC | PRN

## 2020-05-22 MED ORDER — PROCHLORPERAZINE EDISYLATE 10 MG/2ML IJ SOLN
10.0000 mg | Freq: Once | INTRAMUSCULAR | Status: AC
  Administered 2020-05-22: 10 mg via INTRAVENOUS
  Filled 2020-05-22: qty 2

## 2020-05-22 MED FILL — ONDANSETRON ODT 4 MG TABLET: 4 | 6 days supply | Qty: 20 | Fill #0

## 2020-05-22 NOTE — ED Triage Notes (Signed)
Pt. Stated, I live in the [facility].for alcohol.

## 2020-05-22 NOTE — ED Triage Notes (Signed)
Pt. Stated, I hurt, no appetite, chest hurt, this all started last night. I had a COVID shot yesterday.

## 2020-05-22 NOTE — Discharge Instructions (Signed)
As discussed, your evaluation today has been largely reassuring.  But, it is important that you monitor your condition carefully, and do not hesitate to return to the ED if you develop new, or concerning changes in your condition. ? ?Otherwise, please follow-up with your physician for appropriate ongoing care. ? ?

## 2020-05-22 NOTE — ED Provider Notes (Signed)
[place] EMERGENCY DEPARTMENT Provider Note   CSN: [id number] Arrival date & time: 05/22/20  0758     History Chief Complaint  Patient presents with  . Abdominal Pain  . Chest Pain  . Nausea  . Anorexia    Steven Harvey is a 54 y.o. male.  HPI    Patient with multiple medical issues including Covid infection earlier this year presents from his substance abuse housing facility with concern for pain in multiple areas. He notes that yesterday he received his first Covid vaccine.  It is almost 3 months since being discharged from this facility following Covid infection. Over the past day or so he has developed diffuse generalized discomfort, with some focal pain in his right arm.  He does have chronic pain, described as being present for about 30 years in the shoulder, and this is worse as well. No other new chest pain, no new dyspnea, no vomiting, no fever. It is unclear if he has taken any medication for his symptoms.  Seems as though nothing makes his pain better.  Past Medical History:  Diagnosis Date  . Diabetes mellitus without complication ([place])   . Drug abuse [facility])     Patient Active Problem List   Diagnosis Date Noted  . S/P PICC central line placement   . Thrombophlebitis arm   . Nausea   . SOB (shortness of breath)   . Hypophosphatemia   . DKA (diabetic ketoacidoses) ([place]) 02/04/2020  . COVID-19   . Diabetes ([place]) 12/05/2019  . Essential hypertension 12/05/2019  . HLD (hyperlipidemia) 12/05/2019  . Pain in joint, shoulder region 12/05/2019  . Diabetic neuropathy ([place]) 12/05/2019  . Alcoholism /alcohol abuse ([place]) 12/05/2019    History reviewed. No pertinent surgical history.     Family History  Problem Relation Age of Onset  . Heart failure Mother   . Healthy Father     Social History   Tobacco Use  . Smoking status: Former Smoker    Types: Cigarettes  . Smokeless tobacco: Never Used  Vaping Use  . Vaping Use: Never used    Substance Use Topics  . Alcohol use: Not Currently    Comment: recently quit  . Drug use: Not on file    Home Medications Prior to Admission medications   Medication Sig Start Date End Date Taking? Authorizing Provider  glipiZIDE (GLUCOTROL XL) 5 MG 24 hr tablet Take 1 tablet (5 mg total) by mouth daily with breakfast. 03/07/20 05/22/20 Yes McClung, Dionne Bucy, PA-C  Insulin Glargine (BASAGLAR KWIKPEN) 100 UNIT/ML Inject 0.1 mLs (10 Units total) into the skin daily. Patient taking differently: Inject 10 Units into the skin at bedtime.  04/19/20  Yes Newlin, Charlane Ferretti, MD  lisinopril (ZESTRIL) 5 MG tablet Take 1 tablet (5 mg total) by mouth daily. 03/07/20  Yes Freeman Caldron M, PA-C  metFORMIN (GLUCOPHAGE) 500 MG tablet Take 2 tablets (1,000 mg total) by mouth 2 (two) times daily with a meal. 03/07/20 03/07/21 Yes McClung, Angela M, PA-C  simvastatin (ZOCOR) 20 MG tablet Take 1 tablet (20 mg total) by mouth daily. 03/07/20  Yes Freeman Caldron M, PA-C  blood glucose meter kit and supplies KIT Dispense based on patient and insurance preference. Use up to four times daily as directed. (FOR ICD-9 250.00, 250.01). 12/05/19   Raylene Everts, MD  blood glucose meter kit and supplies KIT Dispense based on patient and insurance preference. Use up to four times daily as directed. (FOR ICD-9 250.00, 250.01).  02/22/20   Anderson, Chelsey L, DO  Insulin Pen Needle (TRUEPLUS PEN NEEDLES) 32G X 4 MM MISC Use as instructed to inject insulin. 04/19/20   Charlott Rakes, MD    Allergies    Patient has no known allergies.  Review of Systems   Review of Systems  Constitutional:       Per HPI, otherwise negative  HENT:       Per HPI, otherwise negative  Respiratory:       Per HPI, otherwise negative  Cardiovascular:       Per HPI, otherwise negative  Gastrointestinal: Negative for vomiting.  Endocrine:       Negative aside from HPI  Genitourinary:       Neg aside from HPI   Musculoskeletal:       Per  HPI, otherwise negative  Skin: Negative.   Neurological: Positive for weakness. Negative for syncope.    Physical Exam Updated Vital Signs BP 123/68   Pulse 78   Temp 97.6 F (36.4 C) (Axillary)   Resp 15   Ht '5\' 8"'$  (1.727 m)   Wt 63.5 kg   SpO2 99%   BMI 21.29 kg/m   Physical Exam Vitals and nursing note reviewed.  Constitutional:      General: He is not in acute distress.    Appearance: He is well-developed.  HENT:     Head: Normocephalic and atraumatic.  Eyes:     Conjunctiva/sclera: Conjunctivae normal.  Cardiovascular:     Rate and Rhythm: Normal rate and regular rhythm.  Pulmonary:     Effort: Pulmonary effort is normal. No respiratory distress.     Breath sounds: No stridor.  Abdominal:     General: There is no distension.  Skin:    General: Skin is warm and dry.       Neurological:     Mental Status: He is alert and oriented to person, place, and time.     ED Results / Procedures / Treatments   Labs (all labs ordered are listed, but only abnormal results are displayed) Labs Reviewed  BASIC METABOLIC PANEL - Abnormal; Notable for the following components:      Result Value   Glucose, Bld 107 (*)    All other components within normal limits  CBC - Abnormal; Notable for the following components:   RBC 3.69 (*)    Hemoglobin 10.6 (*)    HCT 32.4 (*)    All other components within normal limits  TROPONIN I (HIGH SENSITIVITY) - Abnormal; Notable for the following components:   Troponin I (High Sensitivity) 28 (*)    All other components within normal limits  TROPONIN I (HIGH SENSITIVITY) - Abnormal; Notable for the following components:   Troponin I (High Sensitivity) 28 (*)    All other components within normal limits    EKG EKG Interpretation  Date/Time:  Tuesday May 22 2020 07:55:42 EDT Ventricular Rate:  95 PR Interval:  144 QRS Duration: 74 QT Interval:  394 QTC Calculation: 495 R Axis:   74 Text Interpretation: Normal sinus rhythm T  wave abnormality No significant change since last tracing QT prolonged Abnormal ECG Confirmed by Carmin Muskrat [phone]) on 05/22/2020 10:49:50 AM   Radiology DG Chest 2 View  Result Date: 05/22/2020 CLINICAL DATA:  Chest pain EXAM: CHEST - 2 VIEW COMPARISON:  February 16, 2020 FINDINGS: The lungs are clear. The heart size and pulmonary vascularity are normal. No adenopathy. No pneumothorax. No bone lesions. IMPRESSION: Lungs clear.  Cardiac silhouette within normal limits. Electronically Signed   By: Lowella Grip III M.D.   On: 05/22/2020 09:23    Procedures Procedures (including critical care time)  Medications Ordered in ED Medications  sodium chloride flush (NS) 0.9 % injection 3 mL (3 mLs Intravenous Not Given 05/22/20 1156)  sodium chloride 0.9 % bolus 1,000 mL (0 mLs Intravenous Stopped 05/22/20 1452)  ketorolac (TORADOL) 30 MG/ML injection 15 mg (15 mg Intravenous Given 05/22/20 1155)  ondansetron (ZOFRAN) injection 4 mg (4 mg Intravenous Given 05/22/20 1155)  prochlorperazine (COMPAZINE) injection 10 mg (10 mg Intravenous Given 05/22/20 1449)    ED Course  I have reviewed the triage vital signs and the nursing notes.  Pertinent labs & imaging results that were available during my care of the patient were reviewed by me and considered in my medical decision making (see chart for details).    MDM Rules/Calculators/A&P                          Patient with multiple medical issues including Covid diagnosis earlier this year presents with multiple complaints per Given his history, multiple medical problems, recent illness, broad differential including infection, ACS, pneumonia, electrolyte abnormalities all considered.   3:28 PM Patient resting.  2 troponins have been essentially unchanged, EKG is nonischemic, unchanged from earlier, no ongoing chest pain, all reassuring for low suspicion of atypical ACS.  Patient has no hypoxia, cough, low suspicion for pneumonia, recurrent  Covid. Patient has been eating crackers, drinking, no evidence for p.o. intolerance, GI distress per Given his substantial improvements, though he does have some discomfort, these may be secondary to his recent Covid vaccine injection.  With otherwise reassuring findings, patient is appropriate for discharge with outpatient follow-up in the clinic. Final Clinical Impression(s) / ED Diagnoses Final diagnoses:  Pain  Weakness    Rx / DC Orders ED Discharge Orders         Ordered    ondansetron (ZOFRAN ODT) 4 MG disintegrating tablet  Every 8 hours PRN     Discontinue  Reprint     05/22/20 1529           Carmin Muskrat, MD 05/22/20 1534

## 2020-05-22 NOTE — ED Notes (Signed)
Jovan (Rep from [facility]#[phone])called for an update and let patient know once he is Discharge give them a call.  Thank you

## 2020-06-06 ENCOUNTER — Other Ambulatory Visit: Payer: Self-pay

## 2020-06-06 ENCOUNTER — Ambulatory Visit: Payer: Self-pay | Attending: Family Medicine | Admitting: Family Medicine

## 2020-06-06 ENCOUNTER — Encounter: Payer: Self-pay | Admitting: Family Medicine

## 2020-06-06 VITALS — BP 151/91 | HR 85 | Ht 68.0 in | Wt 129.4 lb

## 2020-06-06 DIAGNOSIS — E785 Hyperlipidemia, unspecified: Secondary | ICD-10-CM

## 2020-06-06 DIAGNOSIS — G8929 Other chronic pain: Secondary | ICD-10-CM

## 2020-06-06 DIAGNOSIS — Z794 Long term (current) use of insulin: Secondary | ICD-10-CM

## 2020-06-06 DIAGNOSIS — E1142 Type 2 diabetes mellitus with diabetic polyneuropathy: Secondary | ICD-10-CM

## 2020-06-06 DIAGNOSIS — E1140 Type 2 diabetes mellitus with diabetic neuropathy, unspecified: Secondary | ICD-10-CM

## 2020-06-06 DIAGNOSIS — M25511 Pain in right shoulder: Secondary | ICD-10-CM

## 2020-06-06 DIAGNOSIS — M25512 Pain in left shoulder: Secondary | ICD-10-CM

## 2020-06-06 DIAGNOSIS — I10 Essential (primary) hypertension: Secondary | ICD-10-CM

## 2020-06-06 LAB — POCT GLYCOSYLATED HEMOGLOBIN (HGB A1C): HbA1c, POC (controlled diabetic range): 7.9 % — AB (ref 0.0–7.0)

## 2020-06-06 LAB — GLUCOSE, POCT (MANUAL RESULT ENTRY): POC Glucose: 90 mg/dl (ref 70–99)

## 2020-06-06 MED ORDER — LISINOPRIL 10 MG PO TABS: 10.0000 mg | ORAL_TABLET | Freq: Every day | ORAL | 1 refills | Status: AC

## 2020-06-06 MED ORDER — MELOXICAM 7.5 MG PO TABS: 7.5000 mg | ORAL_TABLET | Freq: Every day | ORAL | 1 refills | Status: AC

## 2020-06-06 MED ORDER — BASAGLAR KWIKPEN 100 UNIT/ML ~~LOC~~ SOPN: 10.0000 [IU] | PEN_INJECTOR | Freq: Every day | SUBCUTANEOUS | 3 refills | Status: AC

## 2020-06-06 MED ORDER — METFORMIN HCL 500 MG PO TABS: 1000.0000 mg | ORAL_TABLET | Freq: Two times a day (BID) | ORAL | 1 refills | Status: AC

## 2020-06-06 MED ORDER — SIMVASTATIN 20 MG PO TABS: 20.0000 mg | ORAL_TABLET | Freq: Every day | ORAL | 1 refills | Status: AC

## 2020-06-06 MED ORDER — GLIPIZIDE ER 5 MG PO TB24: 5.0000 mg | ORAL_TABLET | Freq: Every day | ORAL | 1 refills | Status: AC

## 2020-06-06 MED ORDER — GABAPENTIN 300 MG PO CAPS: 300.0000 mg | ORAL_CAPSULE | Freq: Two times a day (BID) | ORAL | 1 refills | Status: AC

## 2020-06-06 MED FILL — ?SIMVASTATIN 20MG TABLE: 20 | 30 days supply | Qty: 30 | Fill #0

## 2020-06-06 MED FILL — LISINOPRIL 10 MG TABS: 10 | 30 days supply | Qty: 30 | Fill #0

## 2020-06-06 MED FILL — METFORMIN HCL 500 MG TABS: 500 | 30 days supply | Qty: 120 | Fill #0

## 2020-06-06 MED FILL — MELOXICAM 7.5 MG TABLET: 7.5 | 30 days supply | Qty: 30 | Fill #0

## 2020-06-06 MED FILL — glipiZIDE XL 5 MG TB24: 5 | 30 days supply | Qty: 30 | Fill #0

## 2020-06-06 MED FILL — ?BASAGLAR 100 UNITS/ML KWPE: 100 | 30 days supply | Qty: 3 | Fill #0

## 2020-06-06 MED FILL — GABAPENTIN 300 MG CAPSULE: 300 | 30 days supply | Qty: 60 | Fill #0

## 2020-06-06 NOTE — Progress Notes (Signed)
Subjective:  Patient ID: Steven Harvey, male    DOB: 1966/07/07  Age: 54 y.o. MRN: [id number]  CC: Diabetes   HPI Steven Harvey is a 54 year old male with a history of type 2 diabetes mellitus (A1c 7.9), hypertension, COVID-19 in 01/2020 s/p monoclonal antibody infusion today for chronic disease management.  He has a >30 year h/o b/l shoulder pain L>R  Preventing him from lifting heavy things as he feels weak in his upper extremities. He also has thoracolumbar back pain and denies alleviating or aggravating factors.  Currently does not take any analgesics for this. He also has numbness in his feet and is unsure of the duration.  Of note his A1c is 7.9 which is down from 13.2 previously.  He denies hypoglycemic episodes, blurry vision. His blood pressure is elevated and he endorses compliance with his antihypertensive.  He will be relocating to [facility] as he has been in [place] for the last 7 months in [facility] due to previous alcohol abuse and has now been clean. Past Medical History:  Diagnosis Date  . Diabetes mellitus without complication ([place])   . Drug abuse ([place])     No past surgical history on file.  Family History  Problem Relation Age of Onset  . Heart failure Mother   . Healthy Father     No Known Allergies  Outpatient Medications Prior to Visit  Medication Sig Dispense Refill  . blood glucose meter kit and supplies KIT Dispense based on patient and insurance preference. Use up to four times daily as directed. (FOR ICD-9 250.00, 250.01). 1 each 0  . blood glucose meter kit and supplies KIT Dispense based on patient and insurance preference. Use up to four times daily as directed. (FOR ICD-9 250.00, 250.01). 1 each 0  . Insulin Glargine (BASAGLAR KWIKPEN) 100 UNIT/ML Inject 0.1 mLs (10 Units total) into the skin daily. (Patient taking differently: Inject 10 Units into the skin at bedtime. ) 3 mL 2  . Insulin Pen Needle (TRUEPLUS PEN NEEDLES) 32G X 4 MM MISC Use as  instructed to inject insulin. 100 each 2  . lisinopril (ZESTRIL) 5 MG tablet Take 1 tablet (5 mg total) by mouth daily. 30 tablet 3  . metFORMIN (GLUCOPHAGE) 500 MG tablet Take 2 tablets (1,000 mg total) by mouth 2 (two) times daily with a meal. 120 tablet 3  . ondansetron (ZOFRAN ODT) 4 MG disintegrating tablet Take 1 tablet (4 mg total) by mouth every 8 (eight) hours as needed for nausea or vomiting. 20 tablet 0  . simvastatin (ZOCOR) 20 MG tablet Take 1 tablet (20 mg total) by mouth daily. 30 tablet 3  . glipiZIDE (GLUCOTROL XL) 5 MG 24 hr tablet Take 1 tablet (5 mg total) by mouth daily with breakfast. 30 tablet 3   No facility-administered medications prior to visit.     ROS Review of Systems  Constitutional: Negative for activity change and appetite change.  HENT: Negative for sinus pressure and sore throat.   Eyes: Negative for visual disturbance.  Respiratory: Negative for cough, chest tightness and shortness of breath.   Cardiovascular: Negative for chest pain and leg swelling.  Gastrointestinal: Negative for abdominal distention, abdominal pain, constipation and diarrhea.  Endocrine: Negative.   Genitourinary: Negative for dysuria.  Musculoskeletal:       See HPI  Skin: Negative for rash.  Allergic/Immunologic: Negative.   Neurological: Positive for numbness. Negative for weakness and light-headedness.  Psychiatric/Behavioral: Negative for dysphoric mood and suicidal ideas.  Objective:  BP (!) 151/91   Pulse 85   Ht '5\' 8"'$  (1.727 m)   Wt 129 lb 6.4 oz (58.7 kg)   SpO2 100%   BMI 19.68 kg/m   BP/Weight 06/06/2020 05/22/2020 3/66/2947  Systolic BP 654 650 354  Diastolic BP 91 96 90  Wt. (Lbs) 129.4 140 135.8  BMI 19.68 21.29 21.27      Physical Exam Constitutional:      Appearance: He is well-developed.  Neck:     Vascular: No JVD.  Cardiovascular:     Rate and Rhythm: Normal rate.     Heart sounds: Normal heart sounds. No murmur heard.   Pulmonary:      Effort: Pulmonary effort is normal.     Breath sounds: Normal breath sounds. No wheezing or rales.  Chest:     Chest wall: No tenderness.  Abdominal:     General: Bowel sounds are normal. There is no distension.     Palpations: Abdomen is soft. There is no mass.     Tenderness: There is no abdominal tenderness.  Musculoskeletal:        General: Normal range of motion.     Right lower leg: No edema.     Left lower leg: No edema.     Comments: Normal appearance of both shoulders with full range of motion bilaterally.  Negative Hawkins, Neer sign. No tenderness on palpation of entire spine.  Normal range of motion of spine.  Neurological:     Mental Status: He is alert and oriented to person, place, and time.  Psychiatric:        Mood and Affect: Mood normal.     CMP Latest Ref Rng & Units 05/22/2020 03/07/2020 02/20/2020  Glucose 70 - 99 mg/dL 107(H) 267(H) 104(H)  BUN 6 - 20 mg/dL '15 14 7  '$ Creatinine 0.61 - 1.24 mg/dL 0.99 0.91 0.88  Sodium 135 - 145 mmol/L 141 143 141  Potassium 3.5 - 5.1 mmol/L 3.7 3.9 3.8  Chloride 98 - 111 mmol/L 107 106 103  CO2 22 - 32 mmol/L '23 20 25  '$ Calcium 8.9 - 10.3 mg/dL 9.2 9.2 8.2(L)  Total Protein 6.0 - 8.5 g/dL - 6.0 -  Total Bilirubin 0.0 - 1.2 mg/dL - <0.2 -  Alkaline Phos 39 - 117 IU/L - 81 -  AST 0 - 40 IU/L - 13 -  ALT 0 - 44 IU/L - 7 -    Lipid Panel     Component Value Date/Time   CHOL 220 (H) 02/05/2020 0235   TRIG 281 (H) 02/05/2020 0235   HDL 40 (L) 02/05/2020 0235   CHOLHDL 5.5 02/05/2020 0235   VLDL 56 (H) 02/05/2020 0235   LDLCALC 124 (H) 02/05/2020 0235    CBC    Component Value Date/Time   WBC 6.3 05/22/2020 0855   RBC 3.69 (L) 05/22/2020 0855   HGB 10.6 (L) 05/22/2020 0855   HGB 9.3 (L) 03/07/2020 1038   HCT 32.4 (L) 05/22/2020 0855   HCT 29.6 (L) 03/07/2020 1038   PLT 197 05/22/2020 0855   PLT 217 03/07/2020 1038   MCV 87.8 05/22/2020 0855   MCV 89 03/07/2020 1038   MCH 28.7 05/22/2020 0855   MCHC 32.7  05/22/2020 0855   RDW 12.9 05/22/2020 0855   RDW 13.6 03/07/2020 1038   LYMPHSABS 1.5 03/07/2020 1038   MONOABS 1.5 (H) 02/06/2020 0651   EOSABS 0.1 03/07/2020 1038   BASOSABS 0.0 03/07/2020 1038    Lab Results  Component Value Date   HGBA1C 7.9 (A) 06/06/2020     Assessment & Plan:  1. Type 2 diabetes mellitus with diabetic neuropathy, with long-term current use of insulin (HCC) Slightly above goal with A1c of 7.9 which is above goal of less than 7.0 This is a tremendous improvement from 13.2 previously No regimen change at the moment as his blood sugars are controlled and anticipate an improvement in his A1c when we checked in 3 months. Counseled on Diabetic diet, my plate method, 524 minutes of moderate intensity exercise/week Blood sugar logs with fasting goals of 80-120 mg/dl, random of less than 180 and in the event of sugars less than 60 mg/dl or greater than 400 mg/dl encouraged to notify the clinic. Advised on the need for annual eye exams, annual foot exams, Pneumonia vaccine. - POCT glucose (manual entry) - POCT glycosylated hemoglobin (Hb A1C) - gabapentin (NEURONTIN) 300 MG capsule; Take 1 capsule (300 mg total) by mouth 2 (two) times daily.  Dispense: 180 capsule; Refill: 1 - Insulin Glargine (BASAGLAR KWIKPEN) 100 UNIT/ML; Inject 0.1 mLs (10 Units total) into the skin at bedtime.  Dispense: 30 mL; Refill: 3  2. Essential hypertension Uncontrolled Increase lisinopril from 5 mg to 10 mg daily Counseled on blood pressure goal of less than 130/80, low-sodium, DASH diet, medication compliance, 150 minutes of moderate intensity exercise per week. Discussed medication compliance, adverse effects. - lisinopril (ZESTRIL) 10 MG tablet; Take 1 tablet (10 mg total) by mouth daily.  Dispense: 90 tablet; Refill: 1  3. Diabetic polyneuropathy associated with type 2 diabetes mellitus (HCC) New diagnosis Commenced on gabapentin - glipiZIDE (GLUCOTROL XL) 5 MG 24 hr tablet; Take 1  tablet (5 mg total) by mouth daily with breakfast.  Dispense: 90 tablet; Refill: 1 - metFORMIN (GLUCOPHAGE) 500 MG tablet; Take 2 tablets (1,000 mg total) by mouth 2 (two) times daily with a meal.  Dispense: 360 tablet; Refill: 1  4. Hyperlipidemia, unspecified hyperlipidemia type Uncontrolled Low-cholesterol diet - simvastatin (ZOCOR) 20 MG tablet; Take 1 tablet (20 mg total) by mouth daily.  Dispense: 90 tablet; Refill: 1  5. Chronic pain of both shoulders Cannot exclude underlying osteoarthritis Trial of NSAID Consider PT if unrelenting - meloxicam (MOBIC) 7.5 MG tablet; Take 1 tablet (7.5 mg total) by mouth daily.  Dispense: 90 tablet; Refill: 1   Return in about 3 months (around 09/06/2020) for Chronic disease management.   Charlott Rakes, MD, FAAFP. [facility] and [place], [place]   06/06/2020, 2:45 PM

## 2020-06-06 NOTE — Progress Notes (Signed)
Constant shoulder pain and lower back pain.  Numbness in legs.

## 2020-06-06 NOTE — Patient Instructions (Signed)

## 2020-07-12 MED FILL — LISINOPRIL 10 MG TABS: 10 | 30 days supply | Qty: 30 | Fill #1

## 2020-07-12 MED FILL — GABAPENTIN 300 MG CAPSULE: 300 | 30 days supply | Qty: 60 | Fill #1

## 2020-07-12 MED FILL — ?BASAGLAR 100 UNITS/ML KWPE: 100 | 28 days supply | Qty: 3 | Fill #1

## 2020-07-12 MED FILL — glipiZIDE XL 5 MG TB24: 5 | 30 days supply | Qty: 30 | Fill #1

## 2020-07-12 MED FILL — ?SIMVASTATIN 20MG TABLE: 20 | 30 days supply | Qty: 30 | Fill #1

## 2020-07-12 MED FILL — MELOXICAM 7.5 MG TABLET: 7.5 | 30 days supply | Qty: 30 | Fill #1

## 2020-07-12 MED FILL — METFORMIN HCL 500 MG TABS: 500 | 30 days supply | Qty: 120 | Fill #1

## 2020-07-13 MED FILL — AMOXICILLIN 500 MG CAPSULE: 500 | 7 days supply | Qty: 21 | Fill #0

## 2020-07-17 ENCOUNTER — Telehealth: Payer: Self-pay | Admitting: Family Medicine

## 2020-07-17 NOTE — Telephone Encounter (Signed)
Paperwork has been received and will be faxed over at the end of the week.

## 2020-07-17 NOTE — Telephone Encounter (Signed)
Copied from CRM [id number]. Topic: General - Inquiry >> Jul 17, 2020 12:27 PM Leary Roca wrote: Reason for CRM: Margo Common from [facility] regarding paperwork that was faxed over . It was faxed [phone] and [phone].  Call back [phone]

## 2020-08-14 ENCOUNTER — Ambulatory Visit: Payer: Self-pay | Admitting: Family Medicine

## 2020-08-27 ENCOUNTER — Ambulatory Visit (HOSPITAL_COMMUNITY): Payer: No Payment, Other | Admitting: Clinical

## 2020-09-05 ENCOUNTER — Ambulatory Visit: Payer: Self-pay | Admitting: Family Medicine

## 2020-09-10 ENCOUNTER — Ambulatory Visit (HOSPITAL_COMMUNITY): Payer: No Payment, Other | Admitting: Psychiatry

## 2020-11-06 IMAGING — CT CT ABD-PELV W/ CM
2 of 6 series · 15 of 46 positions shown, 17 images · IV contrast (APPLIED)
Comparison: CT of the chest abdomen pelvis dated 01/28/2020.

CLINICAL DATA: 54-year-old male with sepsis and generalized
abdominal pain. Positive [id number].

EXAM:
CT ABDOMEN AND PELVIS WITH CONTRAST
TECHNIQUE: Multidetector CT imaging of the abdomen and pelvis was performed
using the standard protocol following bolus administration of
intravenous contrast.
CONTRAST:  100mL OMNIPAQUE IOHEXOL 300 MG/ML  SOLN

[Series 3: abd/ pelvis 5.0 i30f 2 · axial · 0.71mm/px · z∈[+924,+1318]mm · 12 of 91 slices shown, 14 images]
[im 6/91  soft-tissue]
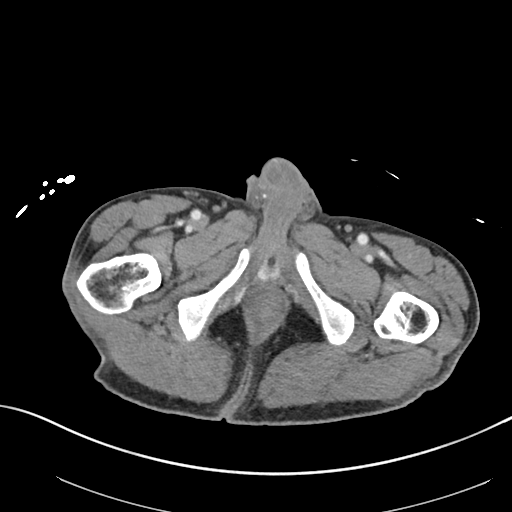
[im 6/91  bone]
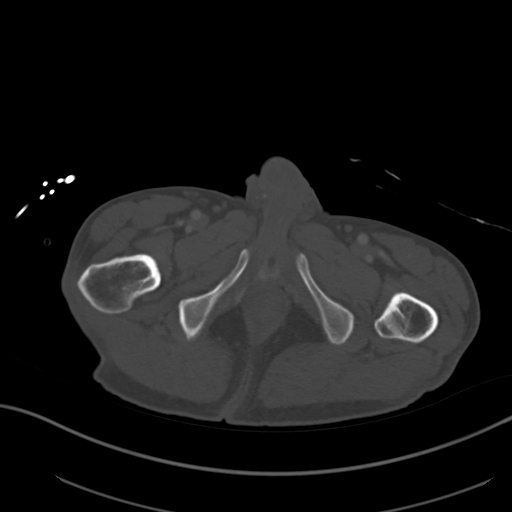
[im 12/91  soft-tissue]
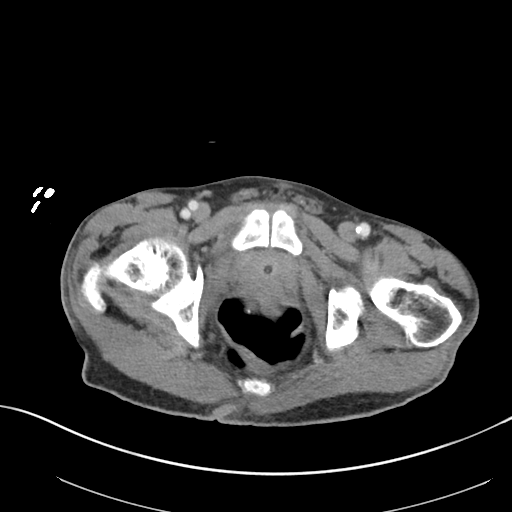
[im 23/91  soft-tissue]
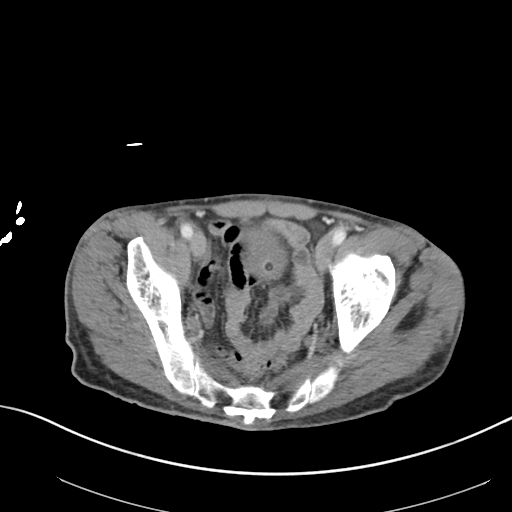
[im 29/91  soft-tissue]
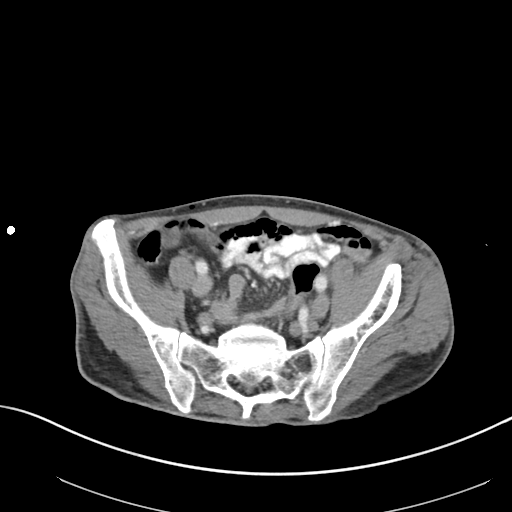
[im 34/91  soft-tissue]
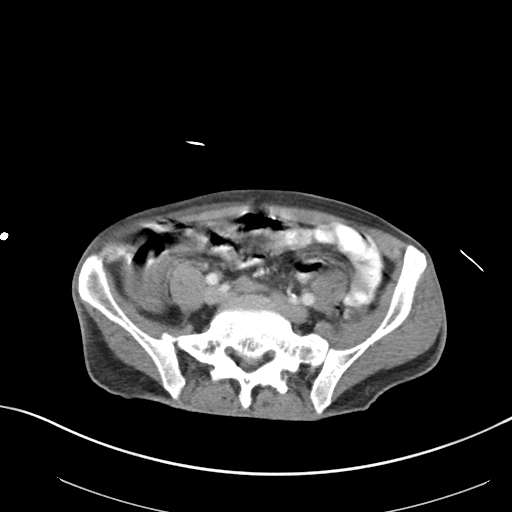
[im 40/91  soft-tissue]
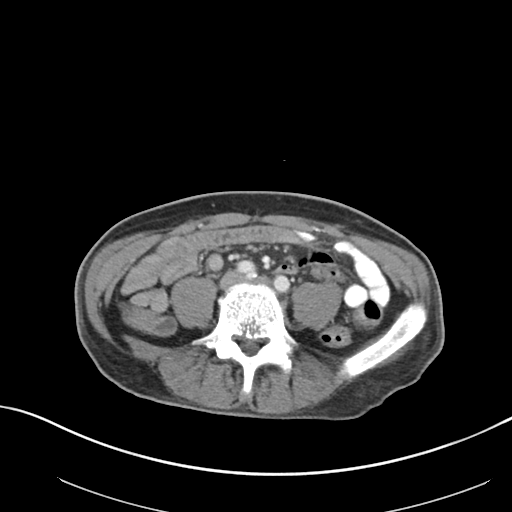
[im 51/91  soft-tissue]
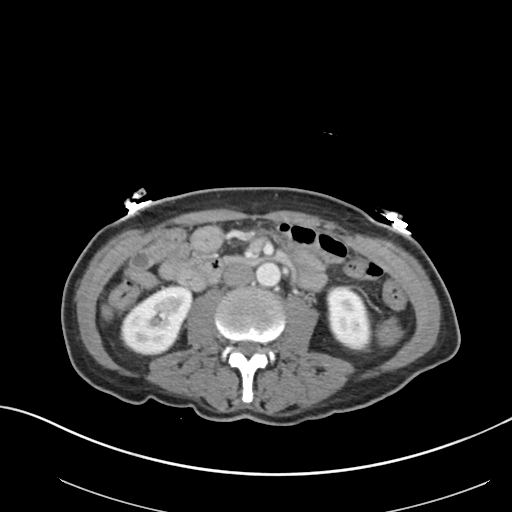
[im 57/91  soft-tissue]
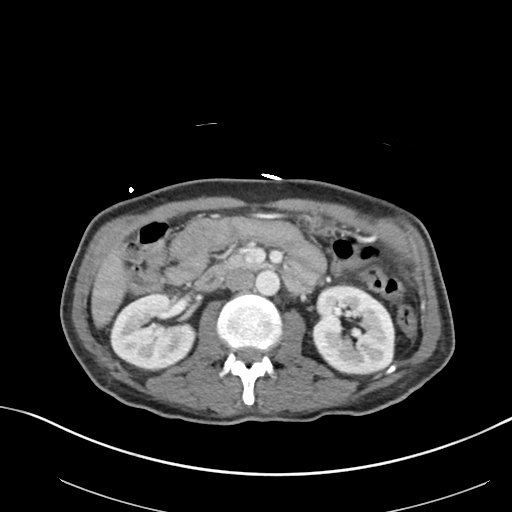
[im 62/91  soft-tissue]
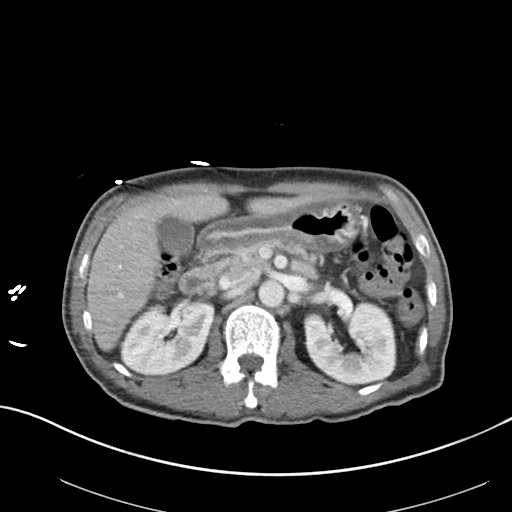
[im 62/91  bone]
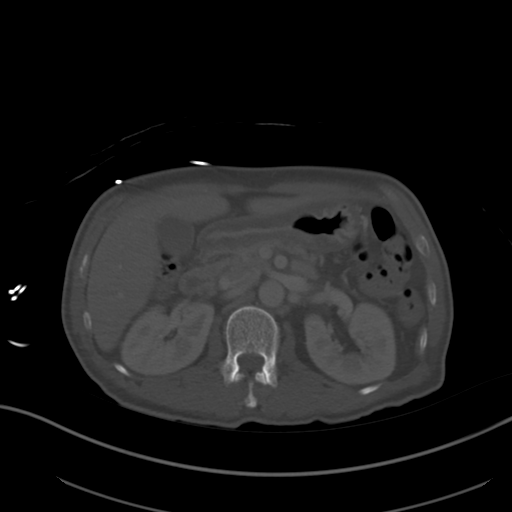
[im 68/91  soft-tissue]
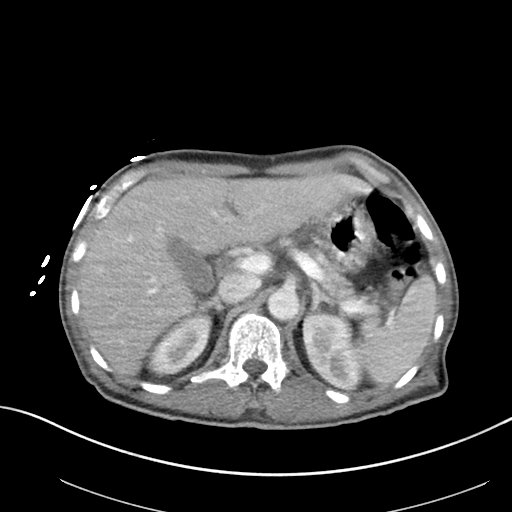
[im 79/91  soft-tissue]
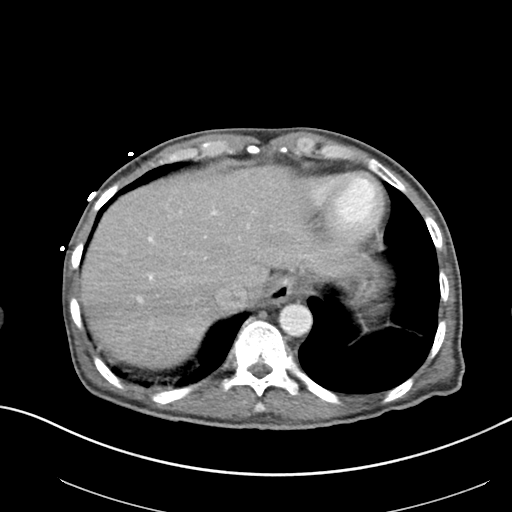
[im 85/91  soft-tissue]
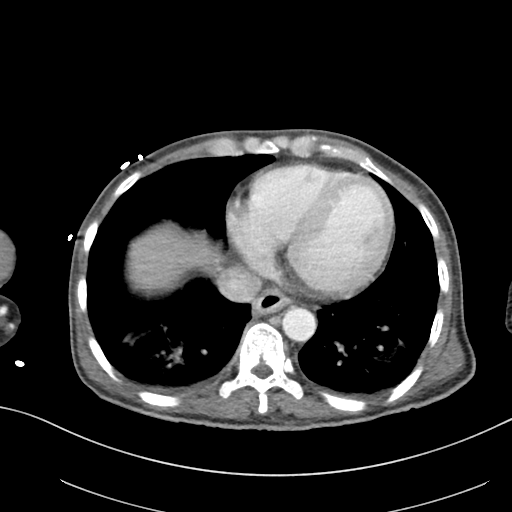

[Series 6: coronal soft tissue · coronal · 0.62mm/px · 3 of 75 slices shown]
[im 25/75  soft-tissue]
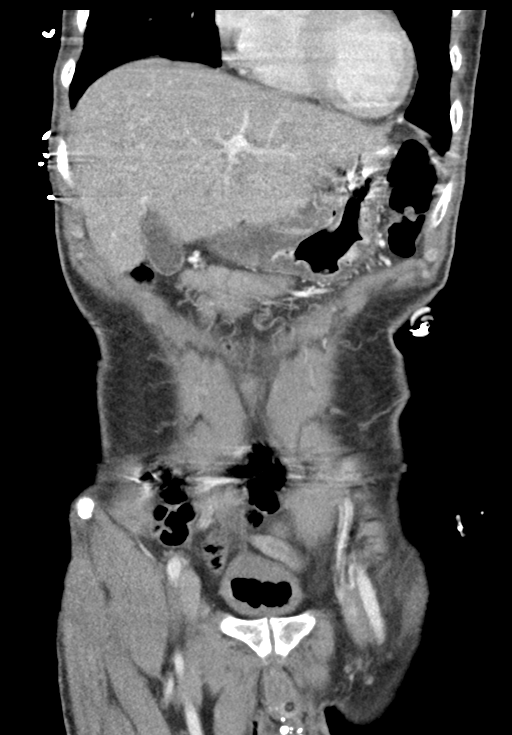
[im 33/75  soft-tissue]
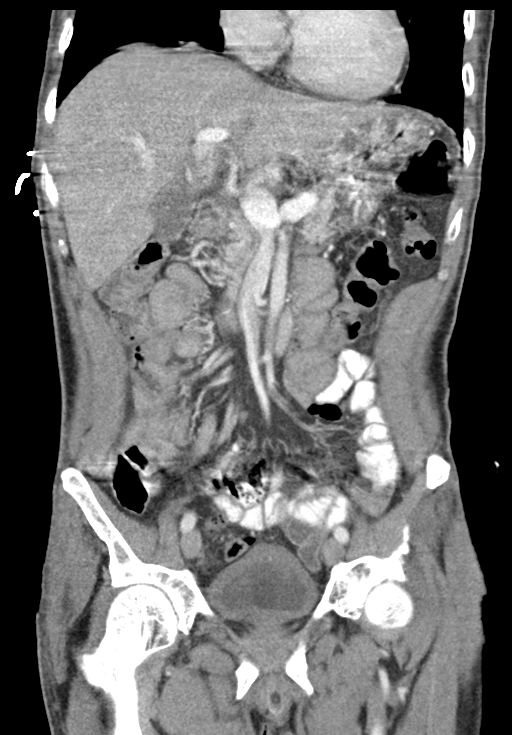
[im 42/75  soft-tissue]
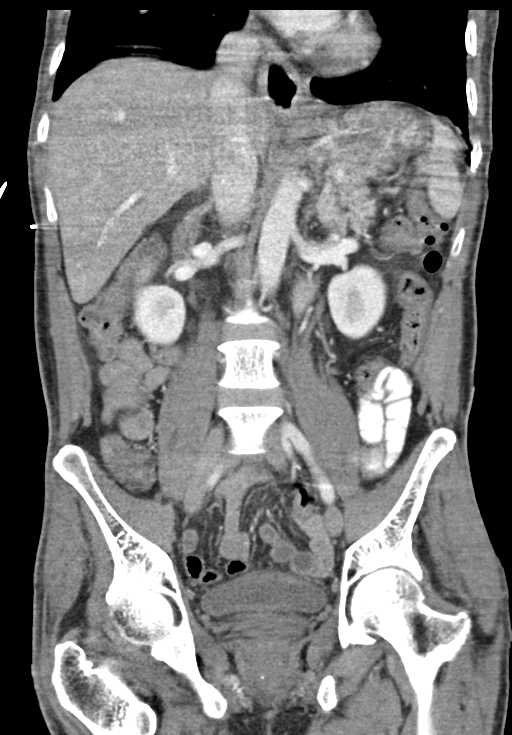

[15 of 46 positions shown; findings below may reference images not displayed]

FINDINGS: Evaluation of this exam is limited due to respiratory motion
artifact.

Lower chest: Partially visualized small bilateral pleural effusions.
Diffuse bilateral lower lobe and lingula confluent densities most
consistent with multifocal pneumonia and in keeping with [id number]-JC.

No intra-abdominal free air. Trace free fluid in the pelvis.

Hepatobiliary: No focal liver abnormality is seen. No gallstones,
gallbladder wall thickening, or biliary dilatation.

Pancreas: Unremarkable. No pancreatic ductal dilatation or
surrounding inflammatory changes.

Spleen: Normal in size without focal abnormality.

Adrenals/Urinary Tract: The adrenal glands, kidneys, and the
visualized ureters appear unremarkable. The urinary bladder is
partially distended despite presence of a Foley catheter. Air within
the bladder likely introduced via catheter. There is apparent
diffuse thickening of the bladder wall which may be partly related
to underdistention. Cystitis is not excluded. Correlation with
urinalysis recommended.

Stomach/Bowel: There is diffuse thickened and edematous appearance
of the gastric wall concerning for gastritis. Clinical correlation
is recommended. There is no bowel obstruction. The appendix is
normal.

Vascular/Lymphatic: The abdominal aorta and IVC are unremarkable. No
portal venous gas. There is no adenopathy.

Reproductive: The prostate and seminal vesicles are grossly
unremarkable.

Other: None

Musculoskeletal: No acute or significant osseous findings.
IMPRESSION: 1. Findings concerning for gastritis. Clinical correlation is
recommended. No bowel obstruction. Normal appendix.
2. Partially visualized small bilateral pleural effusions and
multifocal pneumonia.

## 2020-11-16 IMAGING — DX DG CHEST 1V PORT
1 series · 1 of 1 positions shown · non-contrast
Comparison: 02/04/2020

CLINICAL DATA: Central venous catheter placement

EXAM:
PORTABLE CHEST 1 VIEW

[chest ap]
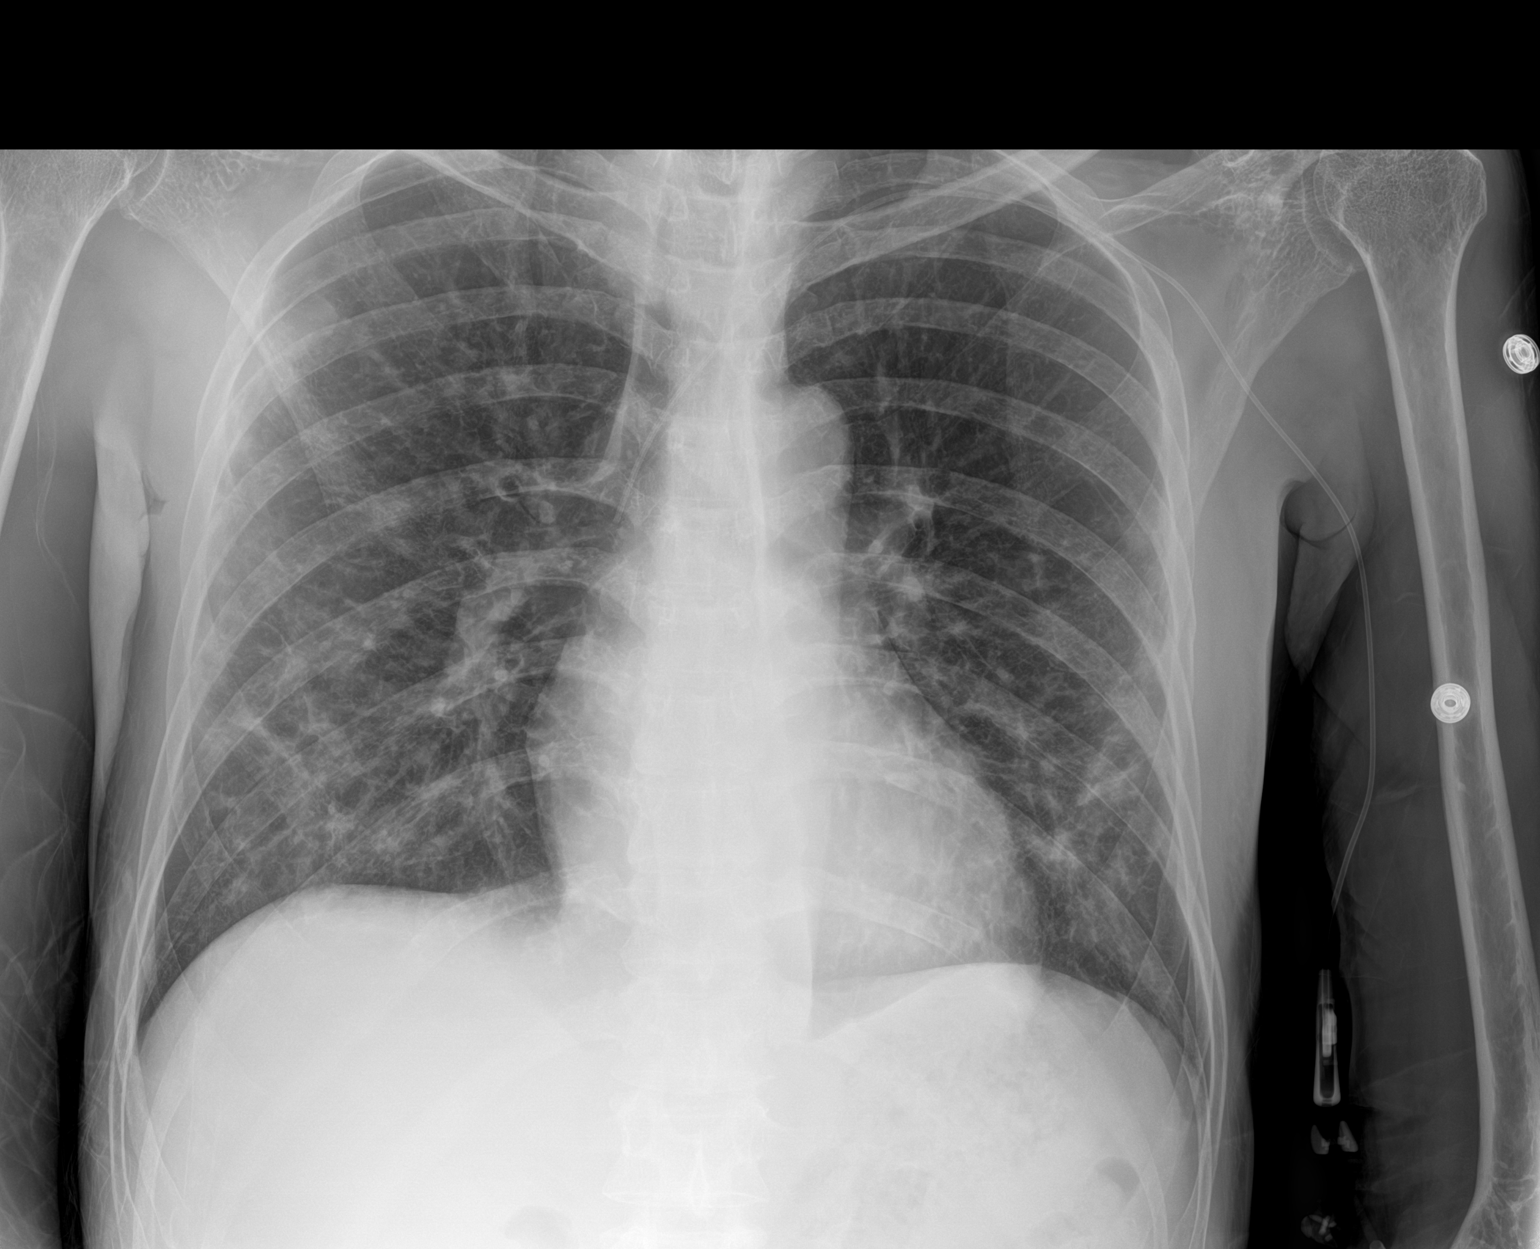

[1 of 1 positions shown; findings below may reference images not displayed]

FINDINGS: There is a left arm PICC line with tip at the distal SVC. Normal
heart size. No pleural effusion or edema identified. Bilateral,
patchy airspace densities are again noted and appear unchanged from
previous examination.
IMPRESSION: 1. Satisfactory position of left arm PICC line.
2. No change in bilateral airspace opacities.

## 2021-02-20 IMAGING — CR DG CHEST 2V
2 series · 2 of 2 positions shown · non-contrast
Comparison: February 16, 2020

CLINICAL DATA: Chest pain

EXAM:
CHEST - 2 VIEW

[chest pa]
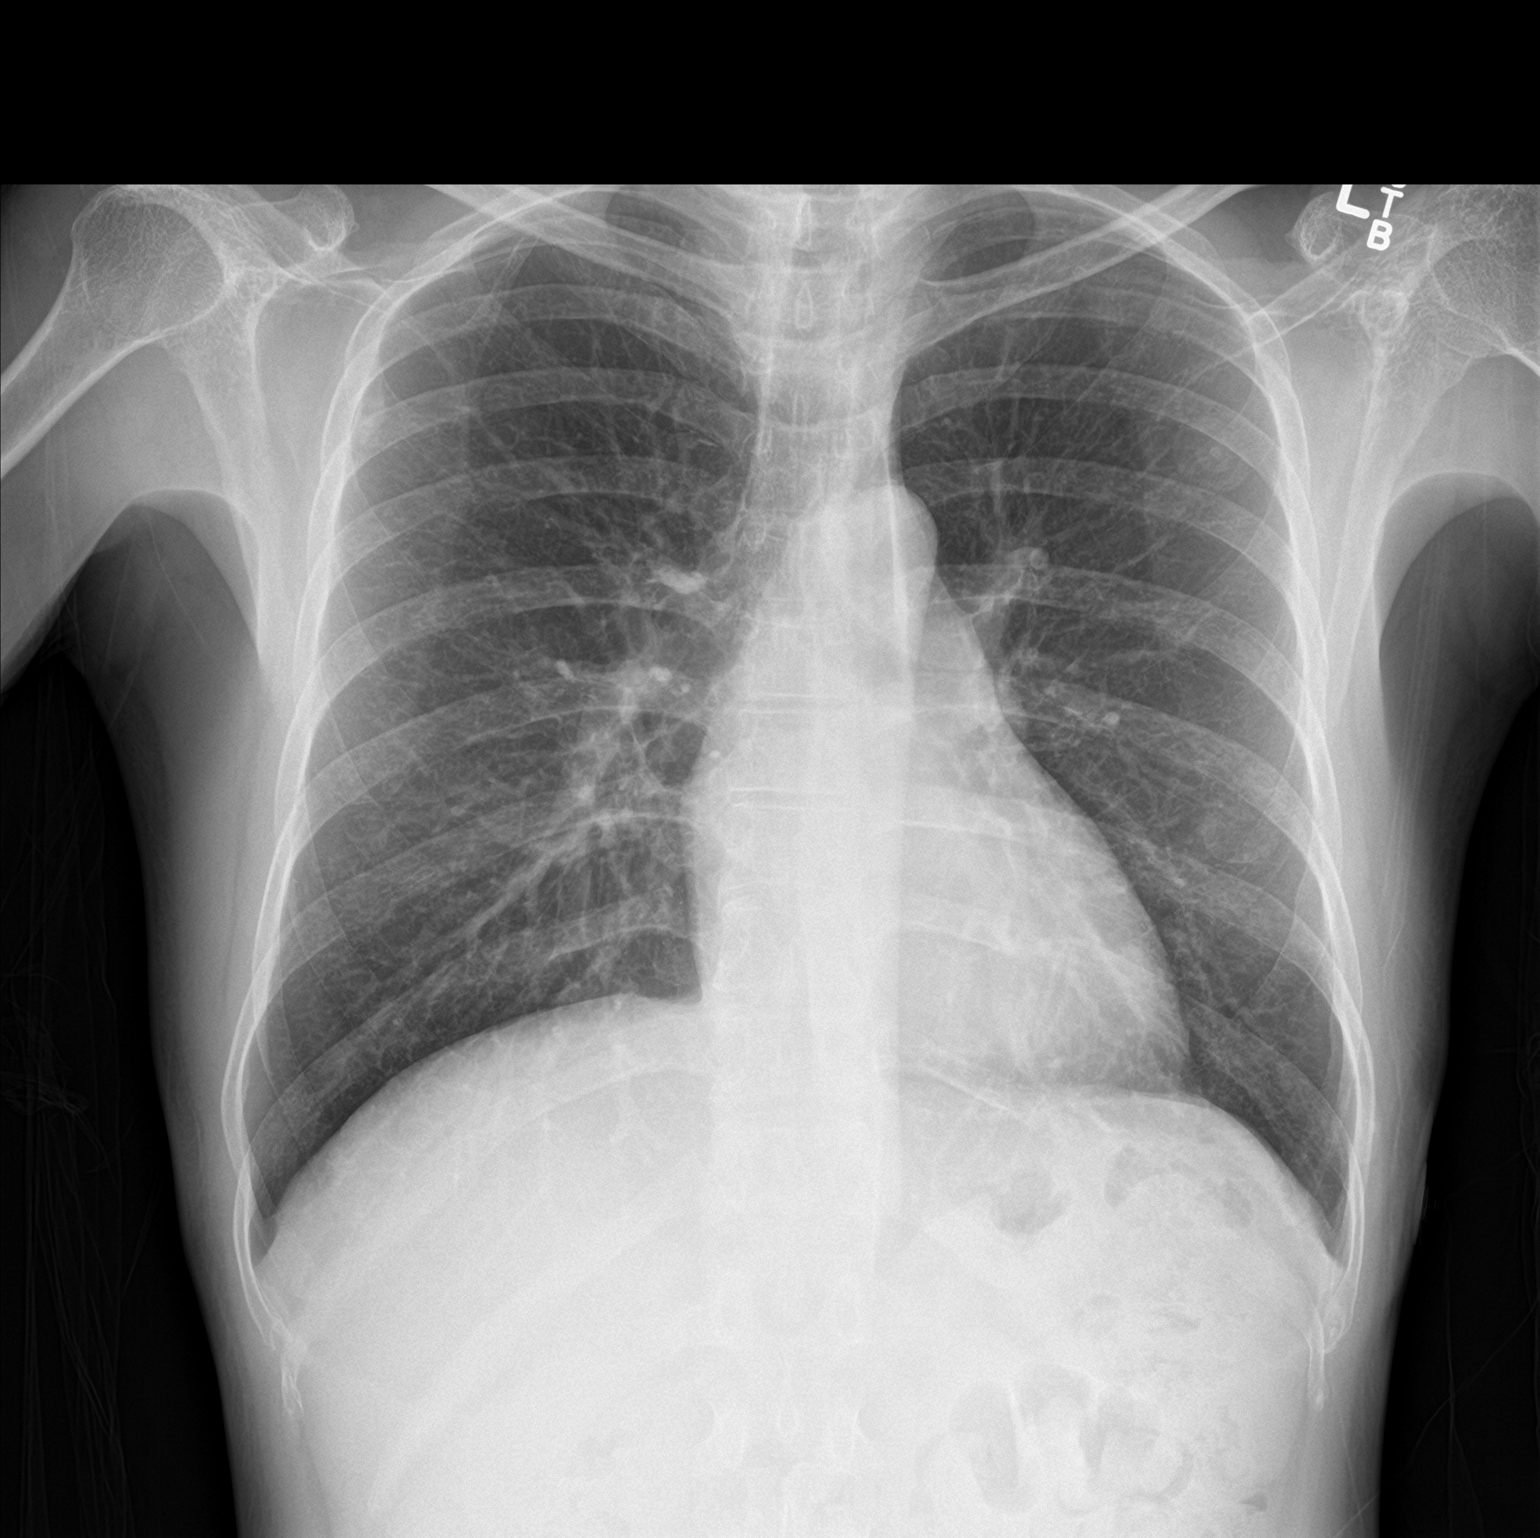

[chest lat]
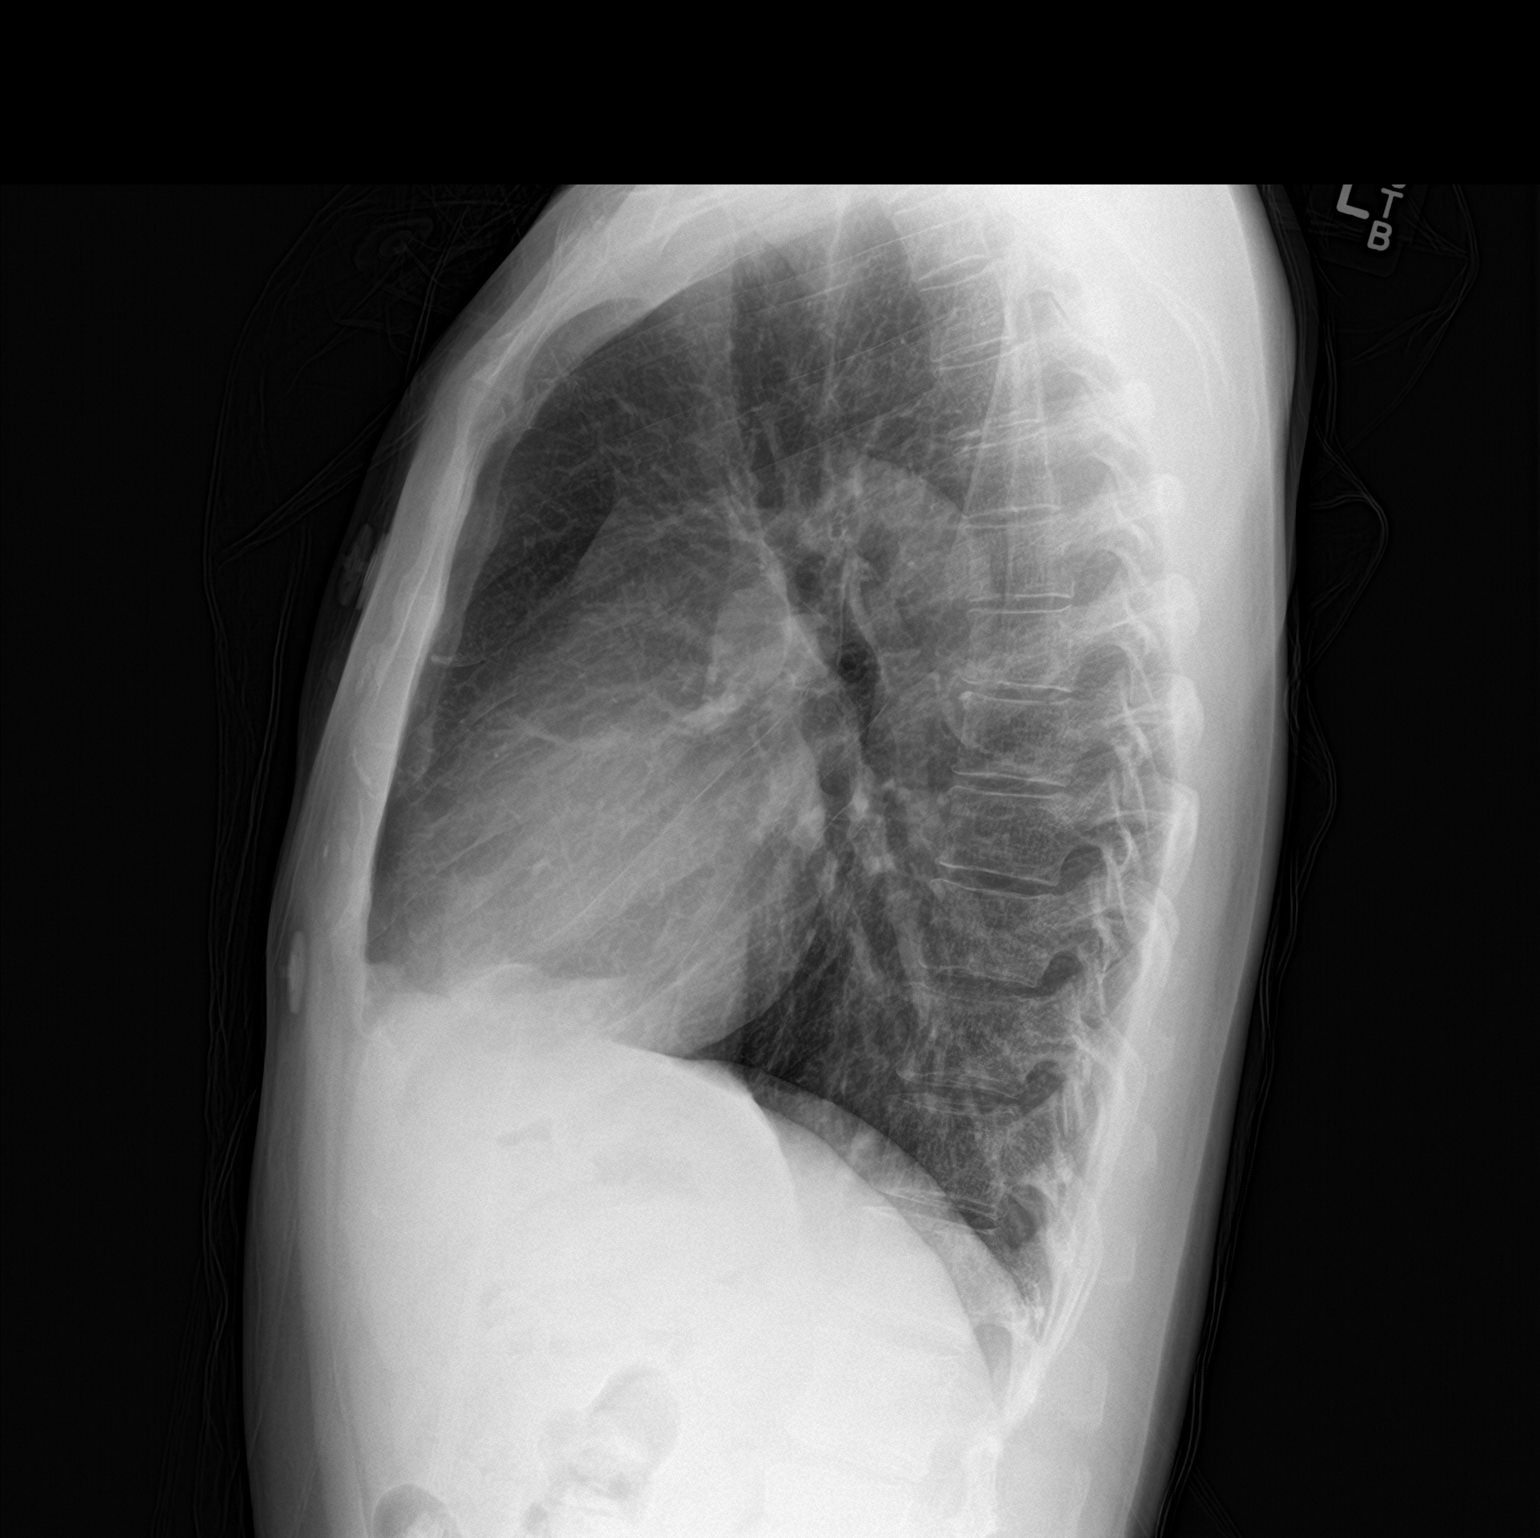

[2 of 2 positions shown; findings below may reference images not displayed]

FINDINGS: The lungs are clear. The heart size and pulmonary vascularity are
normal. No adenopathy. No pneumothorax. No bone lesions.
IMPRESSION: Lungs clear.  Cardiac silhouette within normal limits.
